# Patient Record
Sex: Female | Born: 1985 | Race: White | Hispanic: Yes | Marital: Single | Smoking: Former smoker
Health system: Southern US, Community
[De-identification: ages and names within clinical notes are randomized; demographics above are authoritative.]

## PROBLEM LIST (undated history)

## (undated) HISTORY — PX: DENTAL SURGERY: SHX609

---
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
## 2010-12-18 ENCOUNTER — Emergency Department: Payer: Self-pay | Admitting: Emergency Medicine

## 2012-02-12 IMAGING — CT CT ABD-PELV W/ CM
1 of 2 series · 15 of 32 positions shown, 19 images · IV contrast (isovue)
Comparison: none

REASON FOR EXAM: (1) RLQ pain; (2) same
COMMENTS:   LMP: Two weeks ago

PROCEDURE:     CT  - CT ABDOMEN / PELVIS  W  - December 18, 2010 [DATE]
RESULT:     Comparison:  None
TECHNIQUE: Multiple axial images of the abdomen and pelvis were performed
from the lung bases to the pubic symphysis, with p.o. contrast and with 100
mL of Isovue 370 intravenous contrast.

[Series 2: appendicitis · axial · 0.70mm/px · z∈[-347,+49]mm · 15 of 144 slices shown, 19 images]
[im 6/144  soft-tissue]
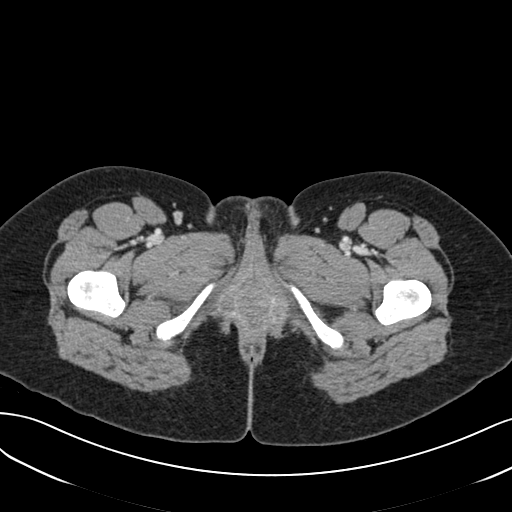
[im 6/144  bone]
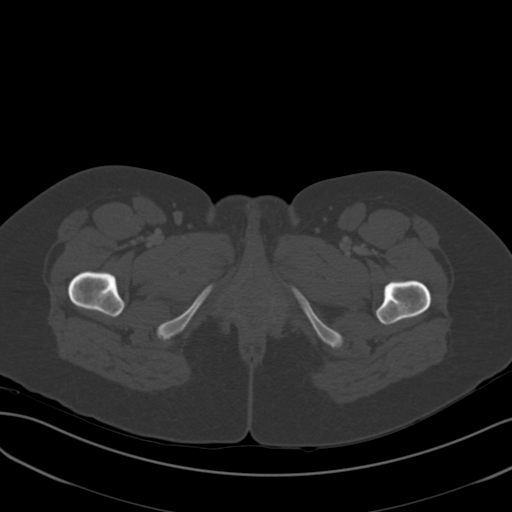
[im 17/144  soft-tissue]
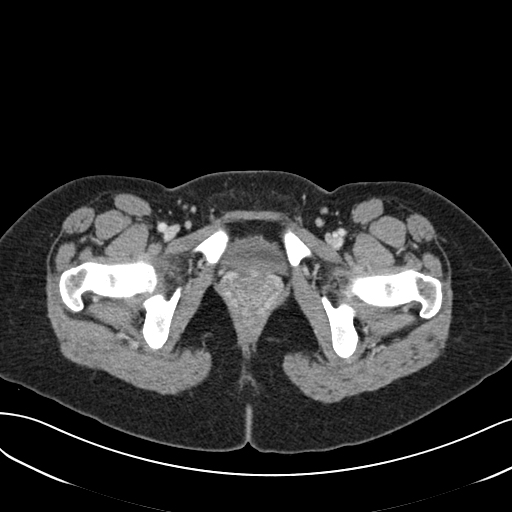
[im 28/144  soft-tissue]
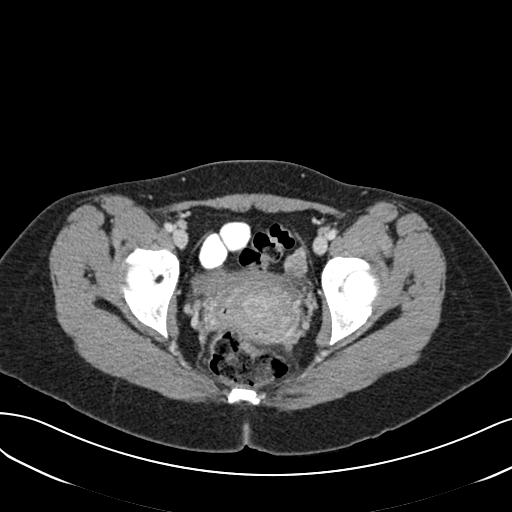
[im 39/144  soft-tissue]
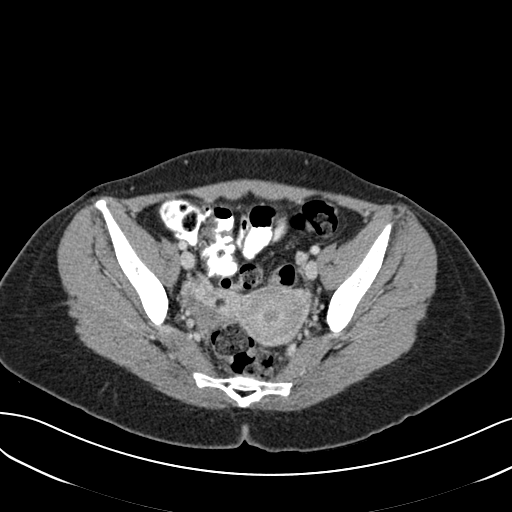
[im 50/144  soft-tissue]
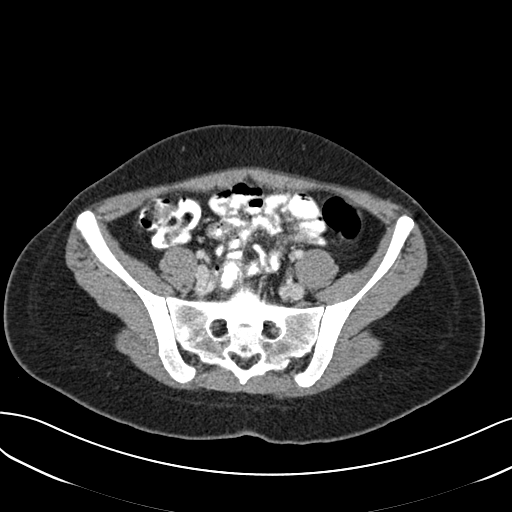
[im 61/144  soft-tissue]
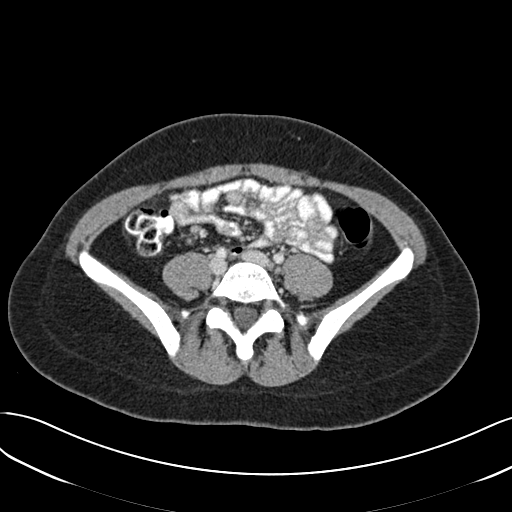
[im 72/144  soft-tissue]
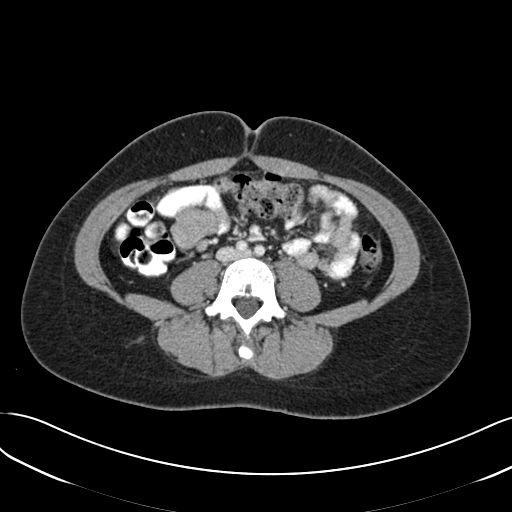
[im 83/144  soft-tissue]
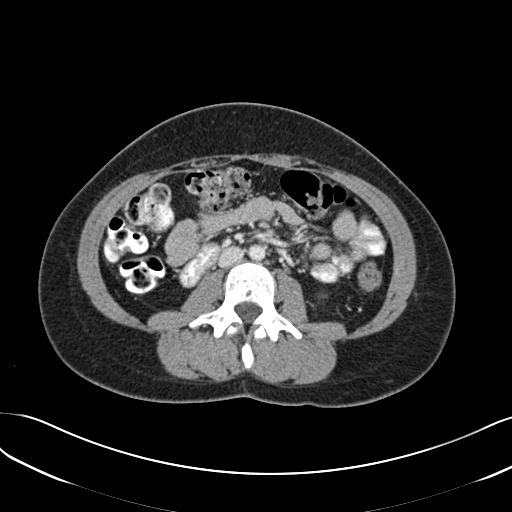
[im 94/144  soft-tissue]
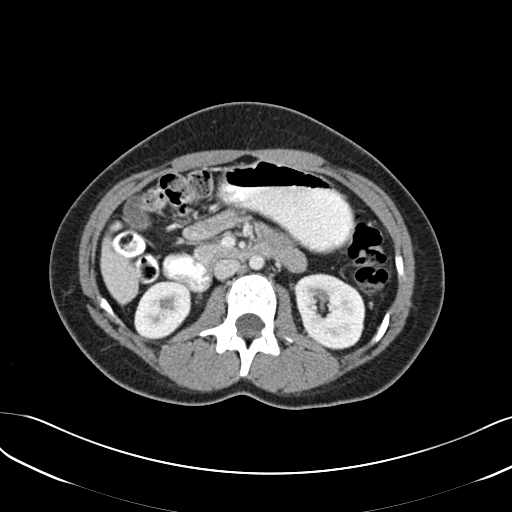
[im 94/144  bone]
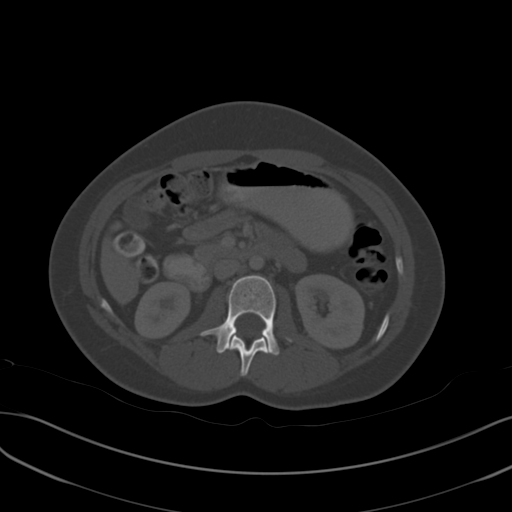
[im 105/144  soft-tissue]
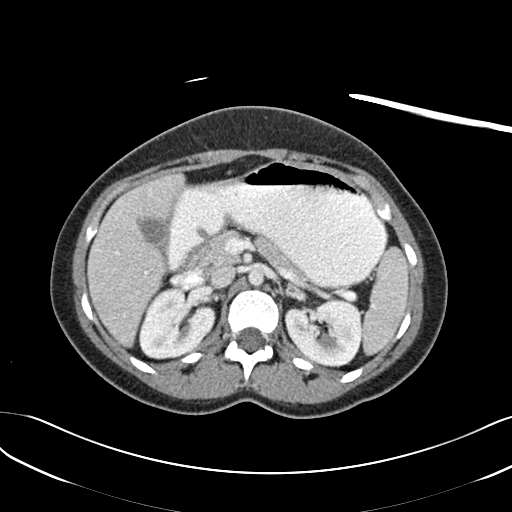
[im 116/144  soft-tissue]
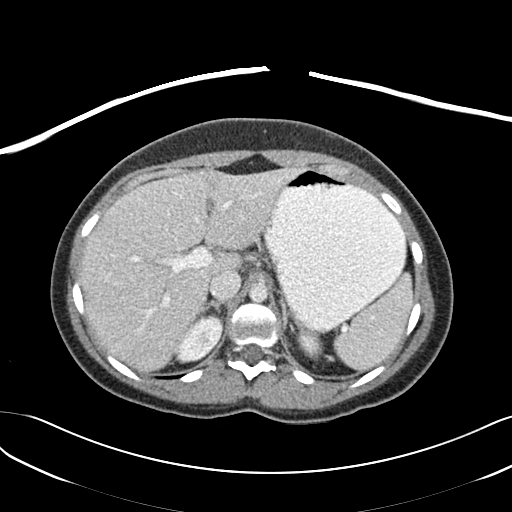
[im 122/144  lung]
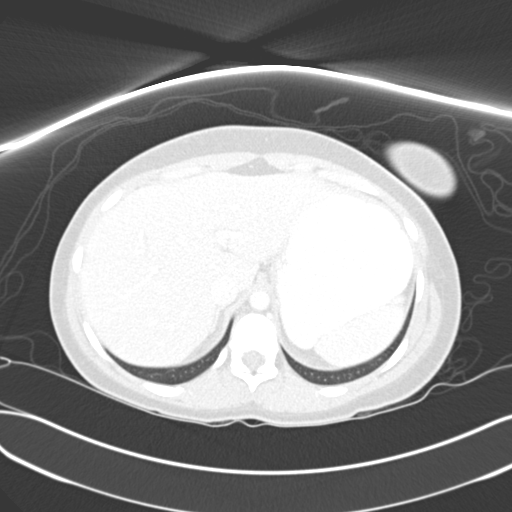
[im 127/144  soft-tissue]
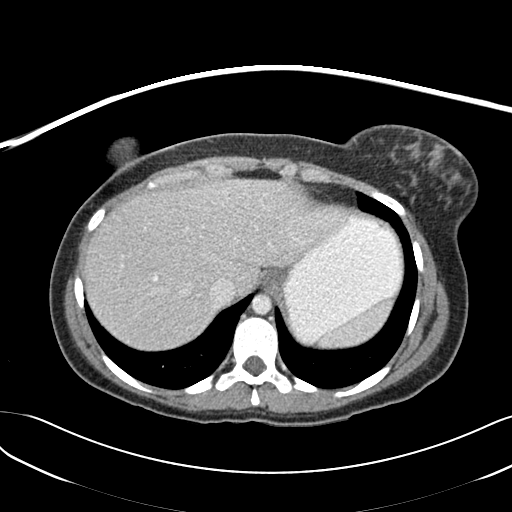
[im 127/144  lung]
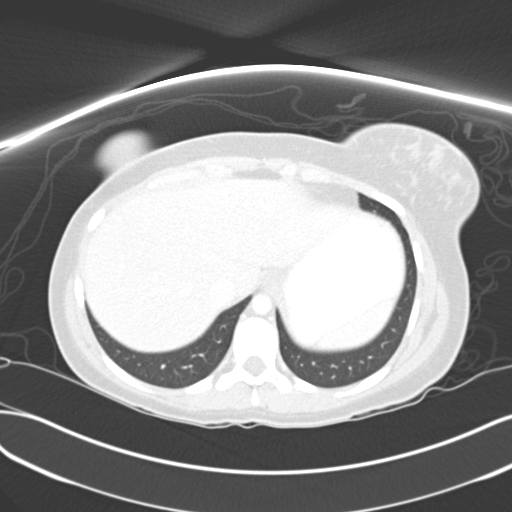
[im 133/144  lung]
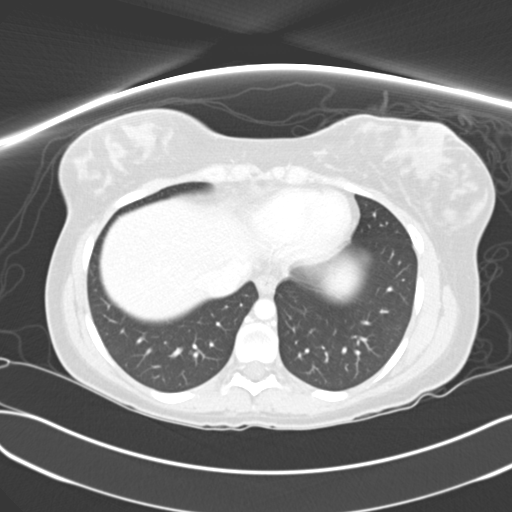
[im 138/144  soft-tissue]
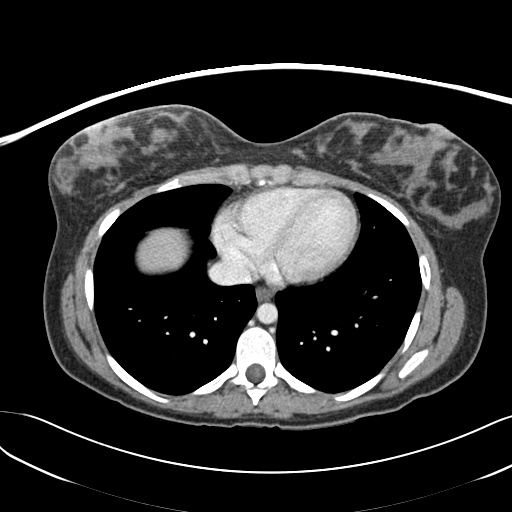
[im 138/144  lung]
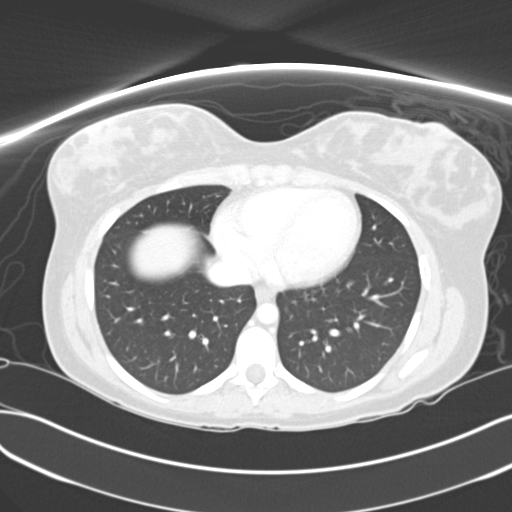

[15 of 32 positions shown; findings below may reference images not displayed]

FINDINGS: The liver enhances normally. Minimal low-attenuation along falciform
ligament likely represents focal fatty deposition. The spleen, adrenals,
pancreas, and gallbladder are unremarkable. The kidneys enhance normally.
The appendix is normal. The small and large bowel are normal in caliber.
There is a trace amount of air within the bladder, likely related to the
recent catheterization for the pelvic ultrasound. There are a few mildly
prominent, but not pathologically enlarged, lymph nodes in the right lower
quadrant.

No aggressive lytic or sclerotic osseous lesions identified.
IMPRESSION: No acute findings in the abdomen or pelvis. Scattered lymph nodes in the
right lower quadrant are nonspecific but can be seen etiologies such as
mesenteric adenitis.

## 2012-10-10 ENCOUNTER — Emergency Department: Payer: Self-pay | Admitting: Emergency Medicine

## 2014-06-03 ENCOUNTER — Emergency Department: Payer: Self-pay | Admitting: Emergency Medicine

## 2015-07-29 IMAGING — US US EXTREM LOW VENOUS*R*
1 series · 13 of 24 positions shown · non-contrast
Comparison: None.

CLINICAL DATA: Right leg pain



[Series 1: us extrem low venous*right* · 0.09mm/px · 13 of 42 slices shown]
[im 1/42]
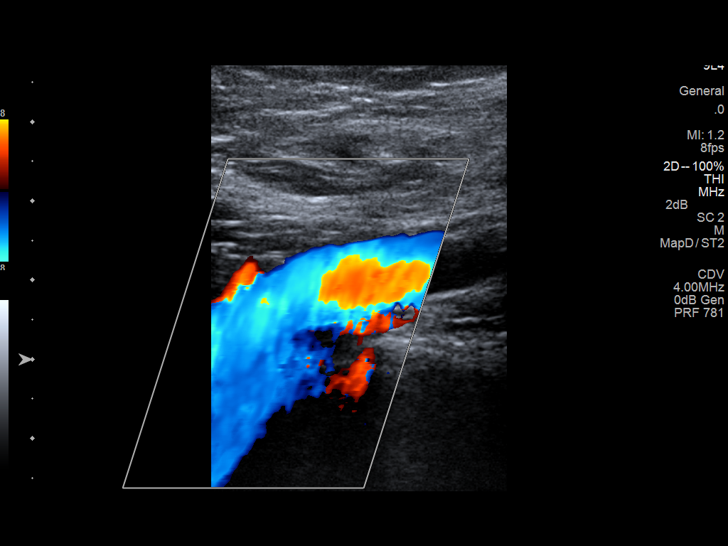
[im 4/42]
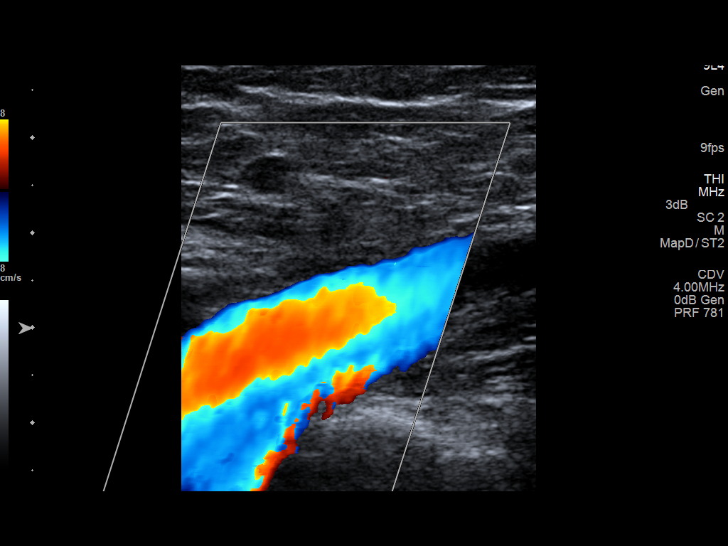
[im 8/42]
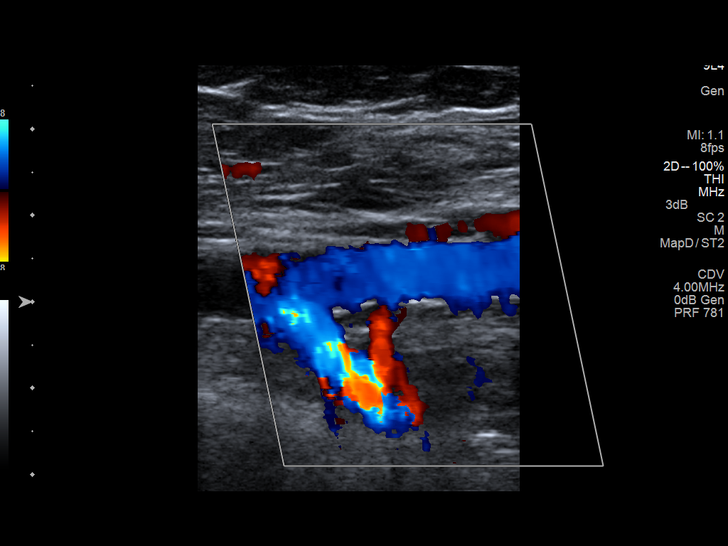
[im 11/42]
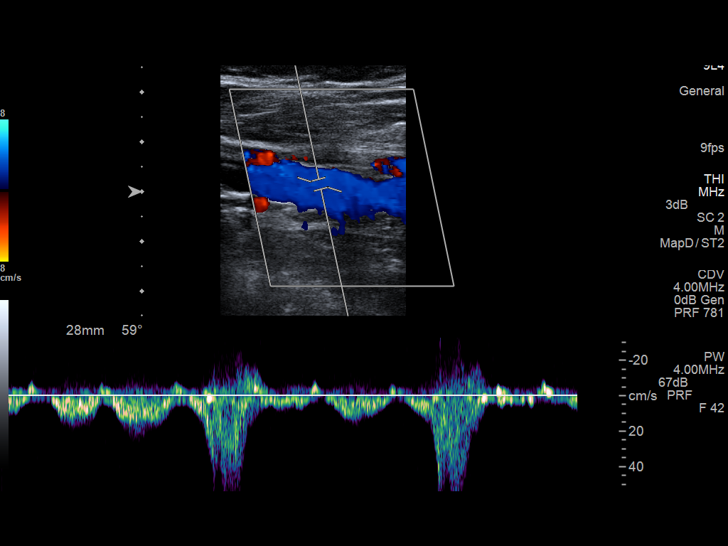
[im 15/42]
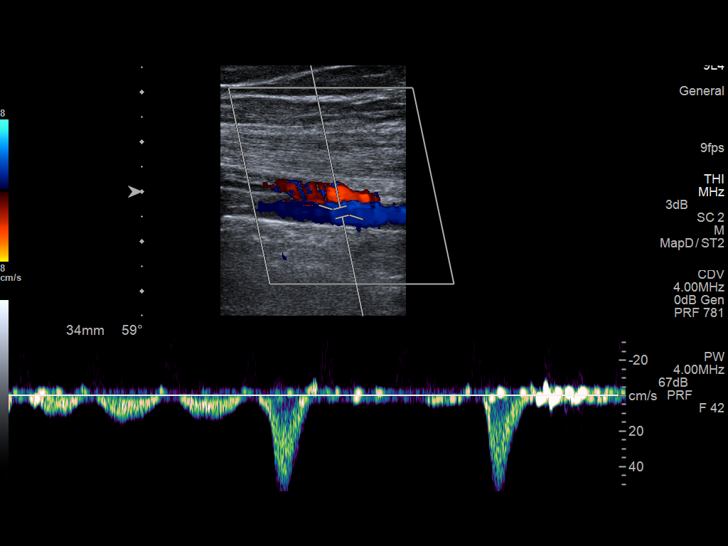
[im 18/42]
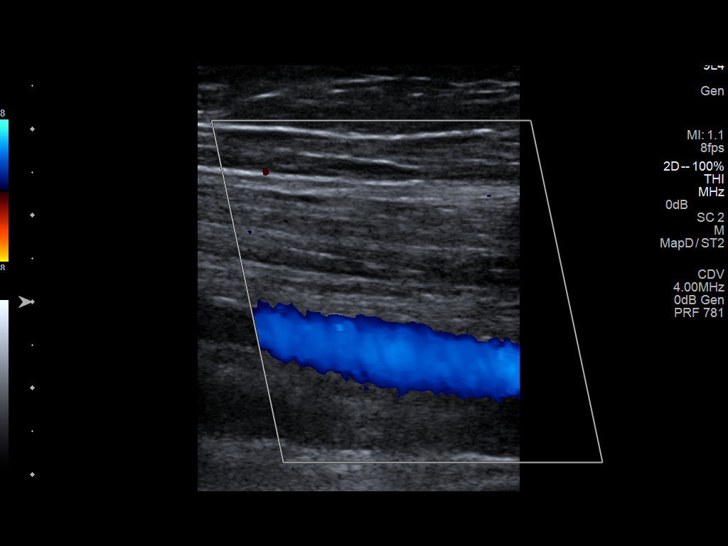
[im 22/42]
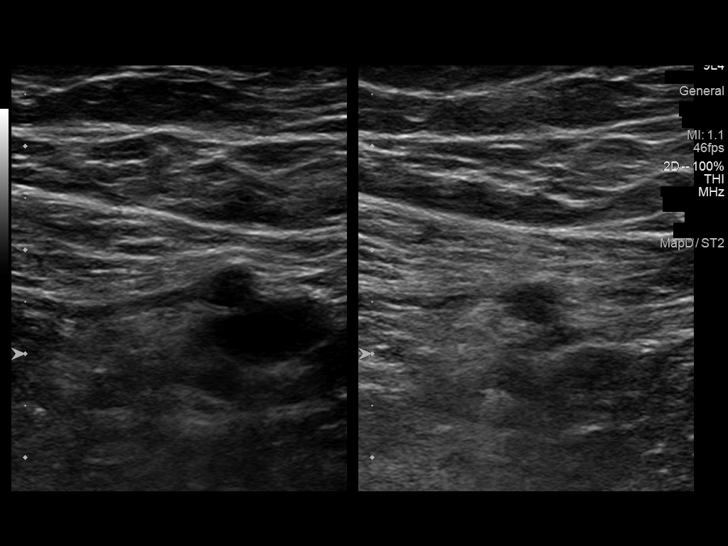
[im 24/42]
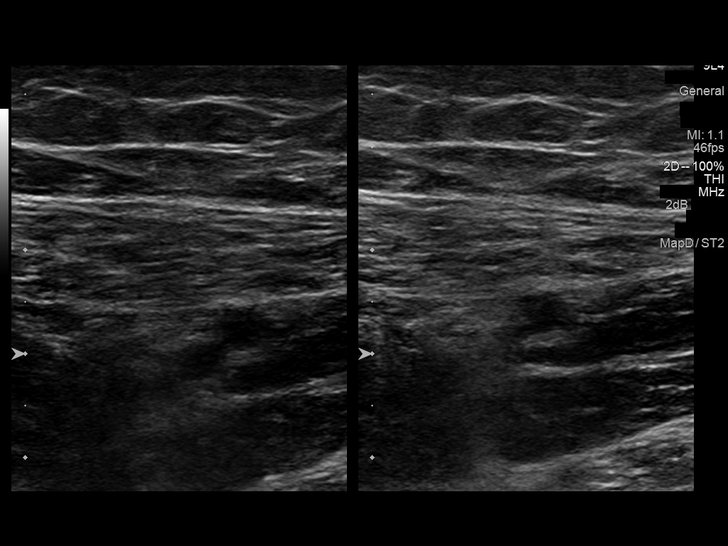
[im 27/42]
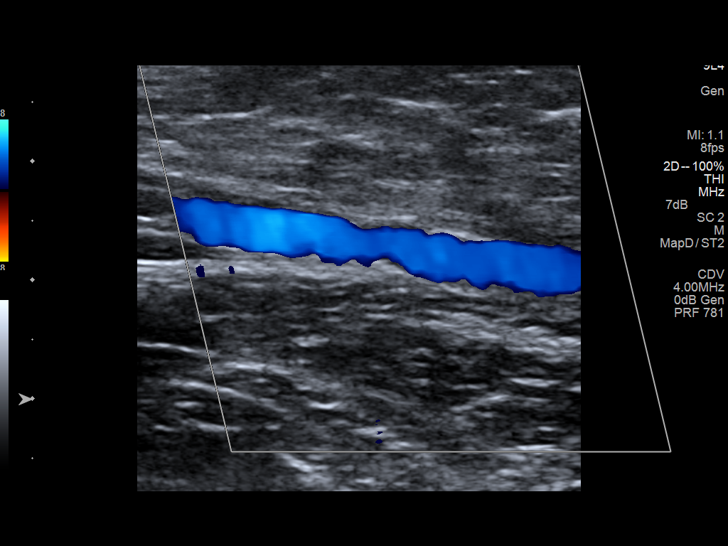
[im 31/42]
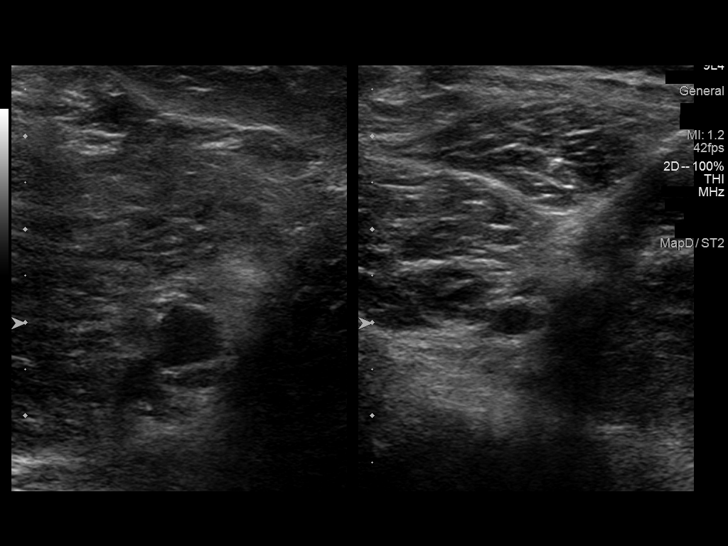
[im 34/42]
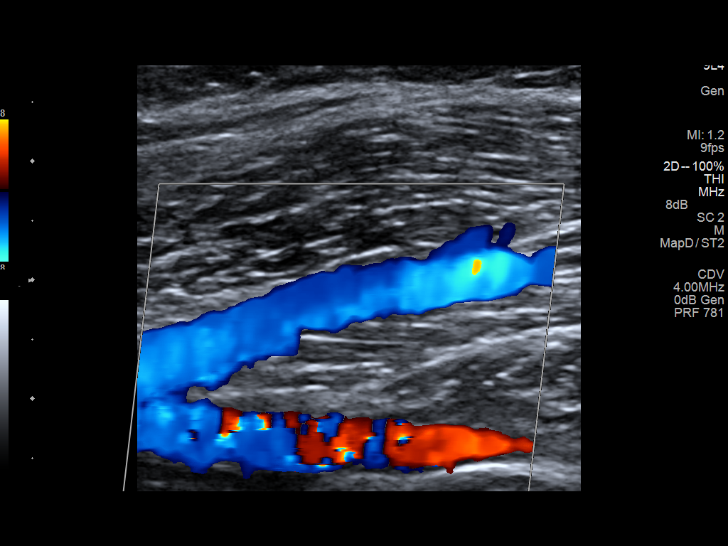
[im 38/42]
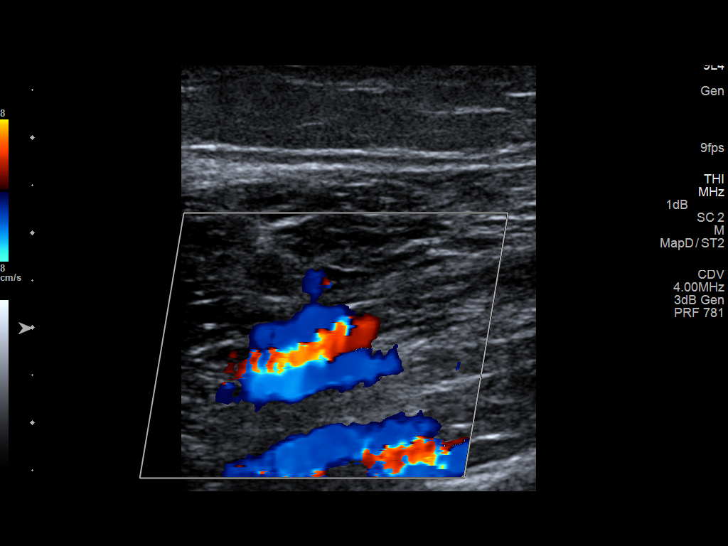
[im 42/42]
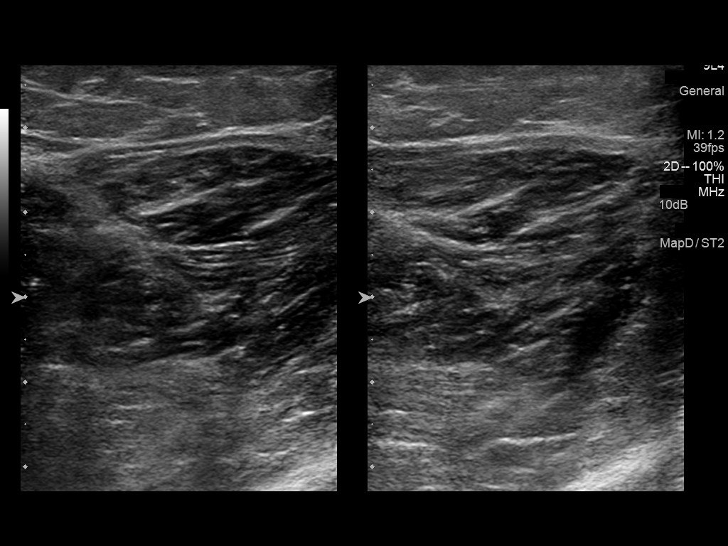

[13 of 24 positions shown; findings below may reference images not displayed]

FINDINGS: Contralateral Common Femoral Vein: Respiratory phasicity is normal
and symmetric with the symptomatic side. No evidence of thrombus.
Normal compressibility.

Common Femoral Vein: No evidence of thrombus. Normal
compressibility, respiratory phasicity and response to augmentation.

Saphenofemoral Junction: No evidence of thrombus. Normal
compressibility and flow on color Doppler imaging.

Profunda Femoral Vein: No evidence of thrombus. Normal
compressibility and flow on color Doppler imaging.

Femoral Vein: No evidence of thrombus. Normal compressibility,
respiratory phasicity and response to augmentation.

Popliteal Vein: No evidence of thrombus. Normal compressibility,
respiratory phasicity and response to augmentation.

Calf Veins: No evidence of thrombus. Normal compressibility and flow
on color Doppler imaging.

Superficial Great Saphenous Vein: No evidence of thrombus. Normal
compressibility and flow on color Doppler imaging.

Venous Reflux:  None.

Other Findings:  None.
IMPRESSION: No evidence of deep venous thrombosis in the right lower extremity.

## 2015-12-16 ENCOUNTER — Emergency Department
Admission: EM | Admit: 2015-12-16 | Discharge: 2015-12-16 | Disposition: A | Discharge status: Home / Self Care | Payer: Self-pay | Attending: Emergency Medicine | Admitting: Emergency Medicine

## 2015-12-16 ENCOUNTER — Emergency Department: Payer: Self-pay

## 2015-12-16 DIAGNOSIS — Y939 Activity, unspecified: Secondary | ICD-10-CM | POA: Insufficient documentation

## 2015-12-16 DIAGNOSIS — Y999 Unspecified external cause status: Secondary | ICD-10-CM | POA: Insufficient documentation

## 2015-12-16 DIAGNOSIS — F17200 Nicotine dependence, unspecified, uncomplicated: Secondary | ICD-10-CM | POA: Insufficient documentation

## 2015-12-16 DIAGNOSIS — Y929 Unspecified place or not applicable: Secondary | ICD-10-CM | POA: Insufficient documentation

## 2015-12-16 DIAGNOSIS — W2201XA Walked into wall, initial encounter: Secondary | ICD-10-CM | POA: Insufficient documentation

## 2015-12-16 DIAGNOSIS — S60221A Contusion of right hand, initial encounter: Secondary | ICD-10-CM | POA: Insufficient documentation

## 2015-12-16 MED ORDER — NAPROXEN 500 MG PO TABS: 500.0000 mg | ORAL_TABLET | Freq: Two times a day (BID) | ORAL | Status: DC

## 2015-12-16 MED ORDER — TRAMADOL HCL 50 MG PO TABS
50.0000 mg | ORAL_TABLET | Freq: Four times a day (QID) | ORAL | Status: DC | PRN
Start: 2015-12-16 — End: 2021-07-26

## 2015-12-16 NOTE — ED Notes (Signed)
Pt states she punched a wall yesterday and has had shooting pains down the lateral side of pinky to elbow ever since.  States she went to work, but they told her to come get a note that says it is okay to come back.  States arm feels like it is "asleep".

## 2015-12-16 NOTE — Discharge Instructions (Signed)
Hand Contusion  A hand contusion is a deep bruise on your hand area. Contusions are the result of an injury that caused bleeding under the skin. The contusion may turn blue, purple, or yellow. Minor injuries will give you a painless contusion, but more severe contusions may stay painful and swollen for a few weeks.  CAUSES   A contusion is usually caused by a blow, trauma, or direct force to an area of the body.  SYMPTOMS    Swelling and redness of the injured area.   Discoloration of the injured area.   Tenderness and soreness of the injured area.   Pain.  DIAGNOSIS   The diagnosis can be made by taking a history and performing a physical exam. An X-ray, CT scan, or MRI may be needed to determine if there were any associated injuries, such as broken bones (fractures).  TREATMENT   Often, the best treatment for a hand contusion is resting, elevating, icing, and applying cold compresses to the injured area. Over-the-counter medicines may also be recommended for pain control.  HOME CARE INSTRUCTIONS    Put ice on the injured area.    Put ice in a plastic bag.    Place a towel between your skin and the bag.    Leave the ice on for 15-20 minutes, 03-04 times a day.   Only take over-the-counter or prescription medicines as directed by your caregiver. Your caregiver may recommend avoiding anti-inflammatory medicines (aspirin, ibuprofen, and naproxen) for 48 hours because these medicines may increase bruising.   If told, use an elastic wrap as directed. This can help reduce swelling. You may remove the wrap for sleeping, showering, and bathing. If your fingers become numb, cold, or blue, take the wrap off and reapply it more loosely.   Elevate your hand with pillows to reduce swelling.   Avoid overusing your hand if it is painful.  SEEK IMMEDIATE MEDICAL CARE IF:    You have increased redness, swelling, or pain in your hand.   Your swelling or pain is not relieved with medicines.   You have loss of feeling in  your hand or are unable to move your fingers.   Your hand turns cold or blue.   You have pain when you move your fingers.   Your hand becomes warm to the touch.   Your contusion does not improve in 2 days.  MAKE SURE YOU:    Understand these instructions.   Will watch your condition.   Will get help right away if you are not doing well or get worse.     This information is not intended to replace advice given to you by your health care provider. Make sure you discuss any questions you have with your health care provider.     Document Released: 12/22/2001 Document Revised: 03/26/2012 Document Reviewed: 12/24/2011  Elsevier Interactive Patient Education 2016 [employer]

## 2015-12-16 NOTE — ED Provider Notes (Signed)
[facility] Emergency Department Provider Note ____________________________________________  Time seen: Approximately 7:58 PM  I have reviewed the triage vital signs and the nursing notes.   HISTORY  Chief Complaint Arm Pain    HPI Shelby Acosta is a 30 y.o. female presents to the emergency department for evaluation of right hand pain. She states that she punched a wall yesterday and has had shooting pains from the pinky and ring finger down to the elbow since that time. She has not taken anything for pain. She denies previous fracture.She is right-hand dominant.  History reviewed. No pertinent past medical history.  There are no active problems to display for this patient.   History reviewed. No pertinent past surgical history.  Current Outpatient Rx  Name  Route  Sig  Dispense  Refill  . naproxen (NAPROSYN) 500 MG tablet   Oral   Take 1 tablet (500 mg total) by mouth 2 (two) times daily with a meal.   30 tablet   0   . traMADol (ULTRAM) 50 MG tablet   Oral   Take 1 tablet (50 mg total) by mouth every 6 (six) hours as needed.   12 tablet   0     Allergies Review of patient's allergies indicates no known allergies.  No family history on file.  Social History Social History  Substance Use Topics  . Smoking status: Current Every Day Smoker  . Smokeless tobacco: None  . Alcohol Use: None    Review of Systems Constitutional: No recent illness. Cardiovascular: Denies chest pain or palpitations. Respiratory: Denies shortness of breath. Musculoskeletal: Pain in Right hand. Skin: Negative for rash, wound, lesion. Neurological: Negative for focal weakness or numbness.  ____________________________________________   PHYSICAL EXAM:  VITAL SIGNS: ED Triage Vitals  Enc Vitals Group     BP --      Pulse --      Resp --      Temp --      Temp src --      SpO2 --      Weight --      Height --      Head Cir --      Peak Flow --       Pain Score 12/16/15 1945 4     Pain Loc --      Pain Edu? --      Excl. in GC? --     Constitutional: Alert and oriented. Well appearing and in no acute distress. Eyes: Conjunctivae are normal. EOMI. Head: Atraumatic. Neck: No stridor.  Respiratory: Normal respiratory effort.   Musculoskeletal: Tenderness over the MCP and metacarpals of the fourth and fifth joints of the right hand. Neurologic:  Normal speech and language. No gross focal neurologic deficits are appreciated. Speech is normal. No gait instability. Skin:  Skin is warm, dry and intact. Contusion noted over the MCP of the fourth and fifth digits of the right hand. Psychiatric: Mood and affect are normal. Speech and behavior are normal.  ____________________________________________   LABS (all labs ordered are listed, but only abnormal results are displayed)  Labs Reviewed - No data to display ____________________________________________  RADIOLOGY  No evidence of acute bony abnormality of the right hand per radiology.  I, Kem Boroughsari Kodee Ravert, personally viewed and evaluated these images (plain radiographs) as part of my medical decision making, as well as reviewing the written report by the radiologist.  ____________________________________________   PROCEDURES  Procedure(s) performed:   Cock-up splint applied by  ER tech. Patient was neurovascularly intact post-application.   ____________________________________________   INITIAL IMPRESSION / ASSESSMENT AND PLAN / ED COURSE  Pertinent labs & imaging results that were available during my care of the patient were reviewed by me and considered in my medical decision making (see chart for details).  Patient was encouraged to follow up with orthopedics for symptoms that are not improving over the week. She was encouraged to take the tramadol and Naprosyn as prescribed if needed. She was advised to return to the emergency department for symptoms change or worsen  if some unable schedule an appointment. ____________________________________________   FINAL CLINICAL IMPRESSION(S) / ED DIAGNOSES  Final diagnoses:  Contusion, hand, right, initial encounter       Chinita Pester, FNP 12/16/15 2121  Loleta Rose, MD 12/16/15 2223

## 2017-02-09 IMAGING — CR DG HAND COMPLETE 3+V*R*
1 series · 3 of 3 positions shown · non-contrast
Comparison: None.

CLINICAL DATA: Pain after trauma

EXAM:
RIGHT HAND - COMPLETE 3+ VIEW

[Series 1: x hand pa right · 0.14mm/px · 3 of 3 slices shown]
[im 1/3]
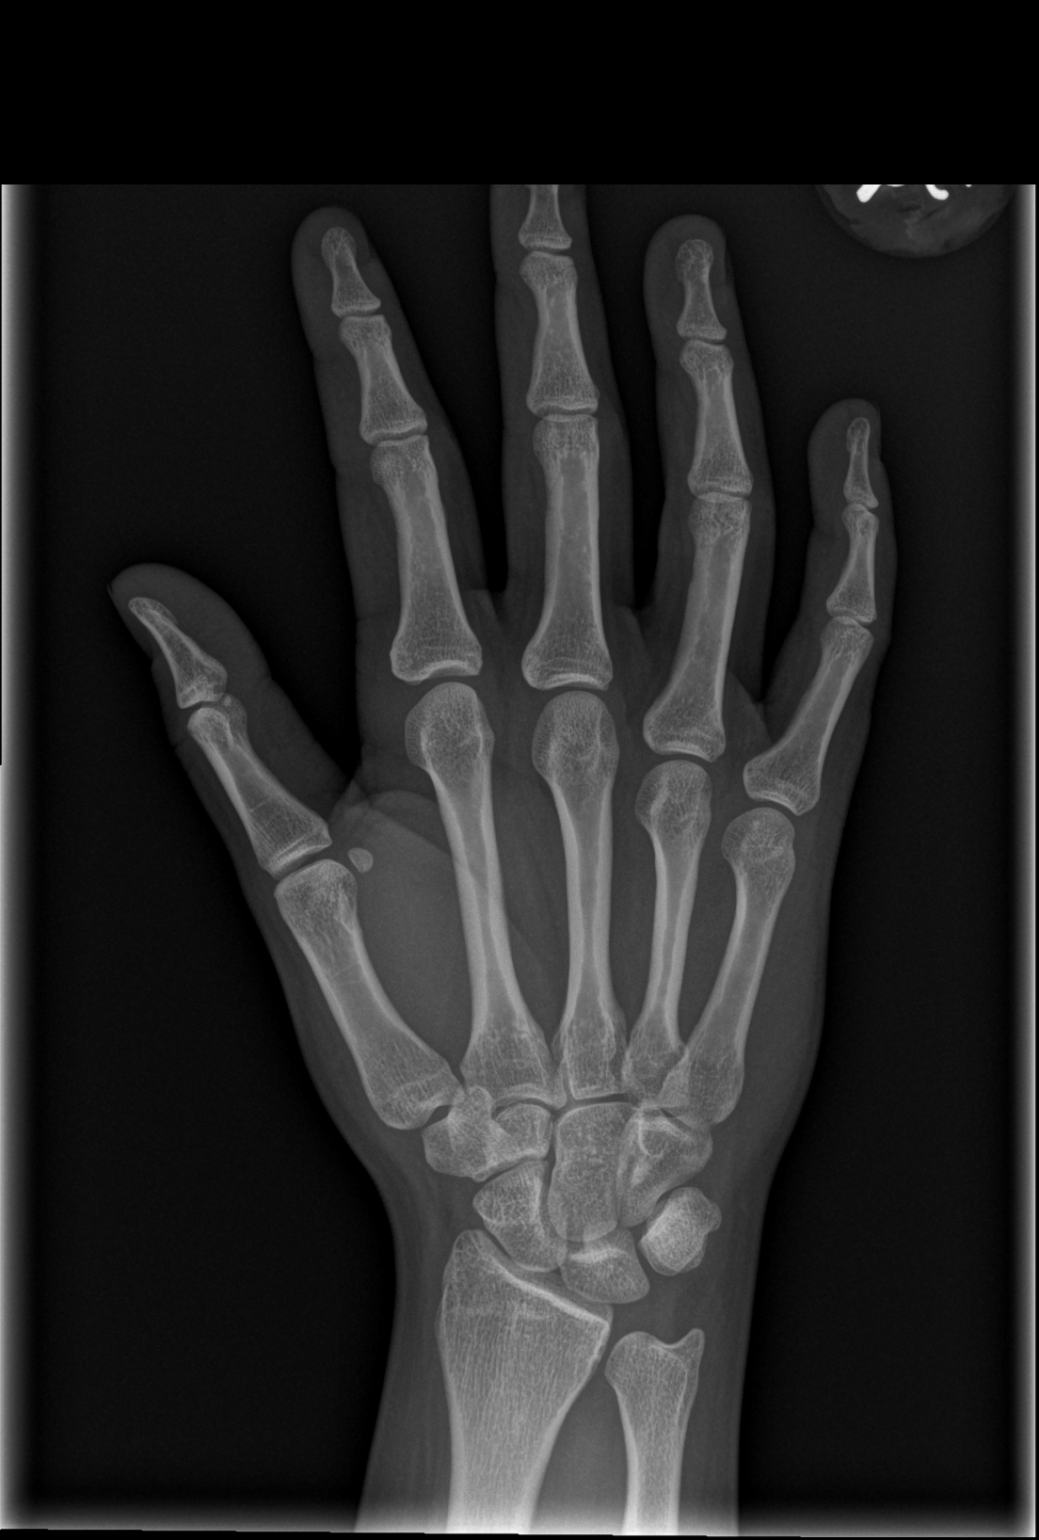
[im 2/3]
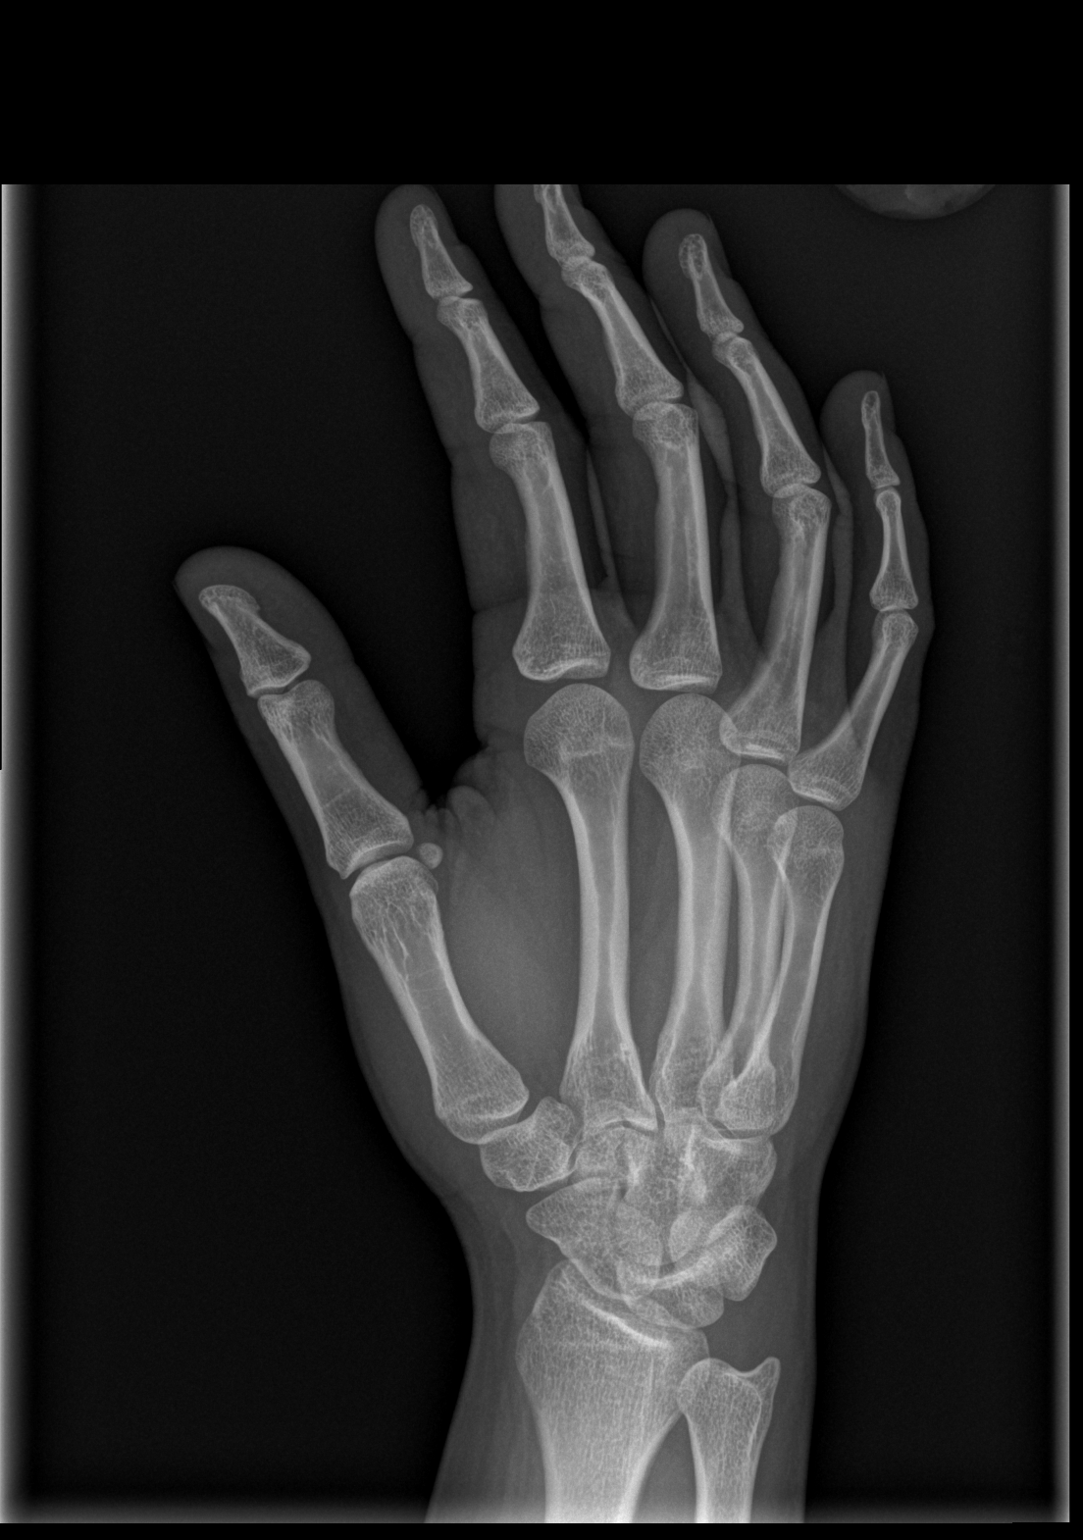
[im 3/3]
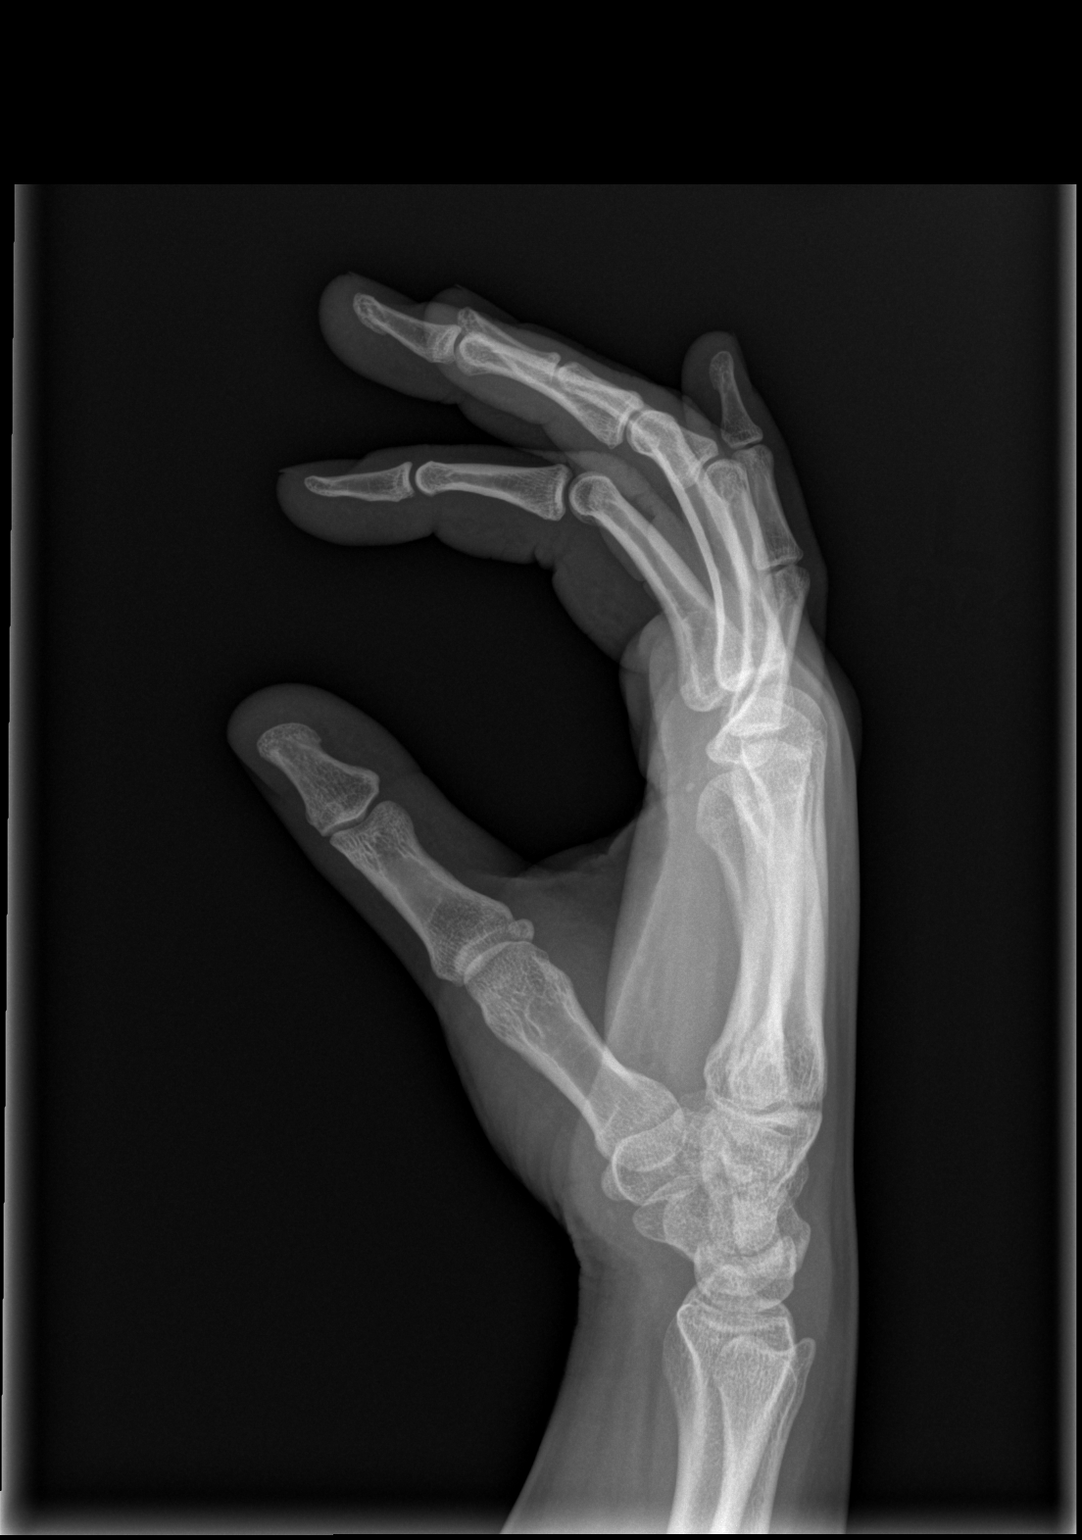

[3 of 3 positions shown; findings below may reference images not displayed]

FINDINGS: There is no evidence of fracture or dislocation. There is no
evidence of arthropathy or other focal bone abnormality. Soft
tissues are unremarkable.
IMPRESSION: Negative.

## 2020-12-19 ENCOUNTER — Ambulatory Visit: Payer: Self-pay | Admitting: Nurse Practitioner

## 2020-12-19 ENCOUNTER — Ambulatory Visit: Payer: Self-pay | Admitting: Internal Medicine

## 2021-05-10 ENCOUNTER — Ambulatory Visit: Payer: Self-pay | Admitting: Family Medicine

## 2021-07-26 ENCOUNTER — Other Ambulatory Visit: Payer: Self-pay

## 2021-07-26 ENCOUNTER — Ambulatory Visit (INDEPENDENT_AMBULATORY_CARE_PROVIDER_SITE_OTHER): Admitting: Family Medicine

## 2021-07-26 ENCOUNTER — Encounter: Payer: Self-pay | Admitting: Family Medicine

## 2021-07-26 VITALS — BP 114/76 | HR 52 | Temp 98.0°F | Ht 59.5 in | Wt 122.6 lb

## 2021-07-26 DIAGNOSIS — Z0000 Encounter for general adult medical examination without abnormal findings: Secondary | ICD-10-CM | POA: Diagnosis not present

## 2021-07-26 DIAGNOSIS — Z23 Encounter for immunization: Secondary | ICD-10-CM

## 2021-07-26 LAB — URINALYSIS, ROUTINE W REFLEX MICROSCOPIC
Bilirubin, UA: NEGATIVE
Glucose, UA: NEGATIVE
Ketones, UA: NEGATIVE
Leukocytes,UA: NEGATIVE
Nitrite, UA: NEGATIVE
RBC, UA: NEGATIVE
Specific Gravity, UA: 1.025 (ref 1.005–1.030)
Urobilinogen, Ur: 1 mg/dL (ref 0.2–1.0)
pH, UA: 6.5 (ref 5.0–7.5)

## 2021-07-26 NOTE — Progress Notes (Signed)
BP 114/76    Pulse (!) 52    Temp 98 F (36.7 C)    Ht 4' 11.5" (1.511 m)    Wt 122 lb 9.6 oz (55.6 kg)    LMP 07/26/2021    SpO2 99%    BMI 24.35 kg/m    Subjective:    Patient ID: Shelby Acosta, female    DOB: 1986/03/28, 36 y.o.   MRN: [id number]  HPI: Shelby Acosta is a 36 y.o. female presenting on 07/26/2021 to establish care and for comprehensive medical examination. Current medical complaints include:none. Has not been to the doctor in many years. She probably has not seen anyone since she was 36yo  She currently lives with: wife Menopausal Symptoms: no  Depression Screen done today and results listed below:  Depression screen [facility] 2/9 07/26/2021  Decreased Interest 0  Down, Depressed, Hopeless 0  PHQ - 2 Score 0  Altered sleeping 0  Tired, decreased energy 0  Change in appetite 1  Feeling bad or failure about yourself  0  Trouble concentrating 0  Moving slowly or fidgety/restless 0  Suicidal thoughts 0  PHQ-9 Score 1  Difficult doing work/chores Not difficult at all    Past Medical History:  History reviewed. No pertinent past medical history.  Surgical History:  Past Surgical History:  Procedure Laterality Date   DENTAL SURGERY      Medications:  No current outpatient medications on file prior to visit.   No current facility-administered medications on file prior to visit.    Allergies:  No Known Allergies  Social History:  Social History   Socioeconomic History   Marital status: Single    Spouse name: Not on file   Number of children: Not on file   Years of education: Not on file   Highest education level: Not on file  Occupational History   Not on file  Tobacco Use   Smoking status: Former    Types: Cigarettes    Quit date: 11/2018    Years since quitting: 2.6   Smokeless tobacco: Not on file  Vaping Use   Vaping Use: Never used  Substance and Sexual Activity   Alcohol use: Yes    Comment: rarely   Drug use: Yes    Types:  Marijuana   Sexual activity: Yes    Birth control/protection: None  Other Topics Concern   Not on file  Social History Narrative   Not on file   Social Determinants of Health   Financial Resource Strain: Not on file  Food Insecurity: Not on file  Transportation Needs: Not on file  Physical Activity: Not on file  Stress: Not on file  Social Connections: Not on file  Intimate Partner Violence: Not on file   Social History   Tobacco Use  Smoking Status Former   Types: Cigarettes   Quit date: 11/2018   Years since quitting: 2.6  Smokeless Tobacco Not on file   Social History   Substance and Sexual Activity  Alcohol Use Yes   Comment: rarely    Family History:  Family History  Problem Relation Age of Onset   Diabetes Mother    Cancer Mother    Hypertension Father    Cancer Paternal Aunt        breast    Past medical history, surgical history, medications, allergies, family history and social history reviewed with patient today and changes made to appropriate areas of the chart.   Review of Systems  Constitutional: Negative.  HENT: Negative.    Eyes: Negative.   Respiratory: Negative.    Cardiovascular: Negative.   Gastrointestinal: Negative.   Genitourinary: Negative.   Musculoskeletal: Negative.   Skin: Negative.   Neurological:  Positive for dizziness (with motion sickness). Negative for tingling, tremors, sensory change, speech change, focal weakness, seizures, loss of consciousness, weakness and headaches.  Endo/Heme/Allergies:  Negative for environmental allergies and polydipsia. Bruises/bleeds easily.  Psychiatric/Behavioral: Negative.    All other ROS negative except what is listed above and in the HPI.      Objective:    BP 114/76    Pulse (!) 52    Temp 98 F (36.7 C)    Ht 4' 11.5" (1.511 m)    Wt 122 lb 9.6 oz (55.6 kg)    LMP 07/26/2021    SpO2 99%    BMI 24.35 kg/m   Wt Readings from Last 3 Encounters:  07/26/21 122 lb 9.6 oz (55.6 kg)   12/16/15 125 lb (56.7 kg)    Physical Exam Vitals and nursing note reviewed.  Constitutional:      General: She is not in acute distress.    Appearance: Normal appearance. She is not ill-appearing, toxic-appearing or diaphoretic.  HENT:     Head: Normocephalic and atraumatic.     Right Ear: Tympanic membrane, ear canal and external ear normal. There is no impacted cerumen.     Left Ear: Tympanic membrane, ear canal and external ear normal. There is no impacted cerumen.     Nose: Nose normal. No congestion or rhinorrhea.     Mouth/Throat:     Mouth: Mucous membranes are moist.     Pharynx: Oropharynx is clear. No oropharyngeal exudate or posterior oropharyngeal erythema.  Eyes:     General: No scleral icterus.       Right eye: No discharge.        Left eye: No discharge.     Extraocular Movements: Extraocular movements intact.     Conjunctiva/sclera: Conjunctivae normal.     Pupils: Pupils are equal, round, and reactive to light.  Neck:     Vascular: No carotid bruit.  Cardiovascular:     Rate and Rhythm: Normal rate and regular rhythm.     Pulses: Normal pulses.     Heart sounds: No murmur heard.   No friction rub. No gallop.  Pulmonary:     Effort: Pulmonary effort is normal. No respiratory distress.     Breath sounds: Normal breath sounds. No stridor. No wheezing, rhonchi or rales.  Chest:     Chest wall: No tenderness.  Abdominal:     General: Abdomen is flat. Bowel sounds are normal. There is no distension.     Palpations: Abdomen is soft. There is no mass.     Tenderness: There is no abdominal tenderness. There is no right CVA tenderness, left CVA tenderness, guarding or rebound.     Hernia: No hernia is present.  Genitourinary:    Comments: Breast and pelvic exams deferred with shared decision making Musculoskeletal:        General: No swelling, tenderness, deformity or signs of injury.     Cervical back: Normal range of motion and neck supple. No rigidity. No  muscular tenderness.     Right lower leg: No edema.     Left lower leg: No edema.  Lymphadenopathy:     Cervical: No cervical adenopathy.  Skin:    General: Skin is warm and dry.     Capillary Refill:  Capillary refill takes less than 2 seconds.     Coloration: Skin is not jaundiced or pale.     Findings: No bruising, erythema, lesion or rash.  Neurological:     General: No focal deficit present.     Mental Status: She is alert and oriented to person, place, and time. Mental status is at baseline.     Cranial Nerves: No cranial nerve deficit.     Sensory: No sensory deficit.     Motor: No weakness.     Coordination: Coordination normal.     Gait: Gait normal.     Deep Tendon Reflexes: Reflexes normal.  Psychiatric:        Mood and Affect: Mood normal.        Behavior: Behavior normal.        Thought Content: Thought content normal.        Judgment: Judgment normal.    No results found for this or any previous visit.    Assessment & Plan:   Problem List Items Addressed This Visit   None Visit Diagnoses     Routine general medical examination at a health care facility    -  Primary   Relevant Orders   CBC with Differential/Platelet   Comprehensive metabolic panel   Lipid Panel w/o Chol/HDL Ratio   Urinalysis, Routine w reflex microscopic   TSH   Hepatitis C Antibody   HIV Antibody (routine testing w rflx)        Follow up plan: Return in about 6 months (around 01/23/2022) for pap.   LABORATORY TESTING:  - Pap smear:  Will get her back to do  IMMUNIZATIONS:   - Tdap: Tetanus vaccination status reviewed: Tdap vaccination indicated and given today. - Influenza: Refused - Pneumovax: Refused - Prevnar: Not applicable - COVID: Up to date  PATIENT COUNSELING:   Advised to take 1 mg of folate supplement per day if capable of pregnancy.   Sexuality: Discussed sexually transmitted diseases, partner selection, use of condoms, avoidance of unintended pregnancy  and  contraceptive alternatives.   Advised to avoid cigarette smoking.  I discussed with the patient that most people either abstain from alcohol or drink within safe limits (<=14/week and <=4 drinks/occasion for males, <=7/weeks and <= 3 drinks/occasion for females) and that the risk for alcohol disorders and other health effects rises proportionally with the number of drinks per week and how often a drinker exceeds daily limits.  Discussed cessation/primary prevention of drug use and availability of treatment for abuse.   Diet: Encouraged to adjust caloric intake to maintain  or achieve ideal body weight, to reduce intake of dietary saturated fat and total fat, to limit sodium intake by avoiding high sodium foods and not adding table salt, and to maintain adequate dietary potassium and calcium preferably from fresh fruits, vegetables, and low-fat dairy products.    stressed the importance of regular exercise  Injury prevention: Discussed safety belts, safety helmets, smoke detector, smoking near bedding or upholstery.   Dental health: Discussed importance of regular tooth brushing, flossing, and dental visits.    NEXT PREVENTATIVE PHYSICAL DUE IN 1 YEAR. Return in about 6 months (around 01/23/2022) for pap.

## 2021-07-27 ENCOUNTER — Encounter: Payer: Self-pay | Admitting: Family Medicine

## 2021-07-27 LAB — LIPID PANEL W/O CHOL/HDL RATIO
Cholesterol, Total: 213 mg/dL — ABNORMAL HIGH (ref 100–199)
HDL: 82 mg/dL (ref >39)
LDL Chol Calc (NIH): 119 mg/dL — ABNORMAL HIGH (ref 0–99)
Triglycerides: 67 mg/dL (ref 0–149)
VLDL Cholesterol Cal: 12 mg/dL (ref 5–40)

## 2021-07-27 LAB — CBC WITH DIFFERENTIAL/PLATELET
Basophils Absolute: 0 10*3/uL (ref 0.0–0.2)
Basos: 1 %
EOS (ABSOLUTE): 0 10*3/uL (ref 0.0–0.4)
Eos: 1 %
Hematocrit: 43.2 % (ref 34.0–46.6)
Hemoglobin: 13.9 g/dL (ref 11.1–15.9)
Immature Grans (Abs): 0 10*3/uL (ref 0.0–0.1)
Immature Granulocytes: 0 %
Lymphocytes Absolute: 1.3 10*3/uL (ref 0.7–3.1)
Lymphs: 39 %
MCH: 28.4 pg (ref 26.6–33.0)
MCHC: 32.2 g/dL (ref 31.5–35.7)
MCV: 88 fL (ref 79–97)
Monocytes Absolute: 0.4 10*3/uL (ref 0.1–0.9)
Monocytes: 13 %
Neutrophils Absolute: 1.5 10*3/uL (ref 1.4–7.0)
Neutrophils: 46 %
Platelets: 294 10*3/uL (ref 150–450)
RBC: 4.9 x10E6/uL (ref 3.77–5.28)
RDW: 12.8 % (ref 11.7–15.4)
WBC: 3.3 10*3/uL — ABNORMAL LOW (ref 3.4–10.8)

## 2021-07-27 LAB — COMPREHENSIVE METABOLIC PANEL
ALT: 15 IU/L (ref 0–32)
AST: 23 IU/L (ref 0–40)
Albumin/Globulin Ratio: 2.1 (ref 1.2–2.2)
Albumin: 4.7 g/dL (ref 3.8–4.8)
Alkaline Phosphatase: 48 IU/L (ref 44–121)
BUN/Creatinine Ratio: 11 (ref 9–23)
BUN: 8 mg/dL (ref 6–20)
Bilirubin Total: 0.5 mg/dL (ref 0.0–1.2)
CO2: 20 mmol/L (ref 20–29)
Calcium: 9.7 mg/dL (ref 8.7–10.2)
Chloride: 102 mmol/L (ref 96–106)
Creatinine, Ser: 0.75 mg/dL (ref 0.57–1.00)
Globulin, Total: 2.2 g/dL (ref 1.5–4.5)
Glucose: 75 mg/dL (ref 70–99)
Potassium: 4.7 mmol/L (ref 3.5–5.2)
Sodium: 139 mmol/L (ref 134–144)
Total Protein: 6.9 g/dL (ref 6.0–8.5)
eGFR: 107 mL/min/{1.73_m2} (ref >59)

## 2021-07-27 LAB — HEPATITIS C ANTIBODY: Hep C Virus Ab: <0.1 s/co ratio (ref 0.0–0.9)

## 2021-07-27 LAB — TSH: TSH: 1.95 u[IU]/mL (ref 0.450–4.500)

## 2021-07-27 LAB — HIV ANTIBODY (ROUTINE TESTING W REFLEX): HIV Screen 4th Generation wRfx: NONREACTIVE

## 2022-01-29 ENCOUNTER — Ambulatory Visit
Admission: RE | Admit: 2022-01-29 | Discharge: 2022-01-29 | Disposition: A | Discharge status: Home / Self Care | Source: Ambulatory Visit | Attending: Family Medicine | Admitting: Family Medicine

## 2022-01-29 ENCOUNTER — Ambulatory Visit: Admitting: Family Medicine

## 2022-01-29 ENCOUNTER — Ambulatory Visit
Admission: RE | Admit: 2022-01-29 | Discharge: 2022-01-29 | Disposition: A | Discharge status: Home / Self Care | Attending: Family Medicine | Admitting: Family Medicine

## 2022-01-29 ENCOUNTER — Other Ambulatory Visit (HOSPITAL_COMMUNITY)
Admission: RE | Admit: 2022-01-29 | Discharge: 2022-01-29 | Disposition: A | Discharge status: Home / Self Care | Source: Ambulatory Visit | Attending: Family Medicine | Admitting: Family Medicine

## 2022-01-29 ENCOUNTER — Encounter: Payer: Self-pay | Admitting: Family Medicine

## 2022-01-29 VITALS — BP 126/78 | HR 59 | Temp 98.7°F | Wt 117.0 lb

## 2022-01-29 DIAGNOSIS — M546 Pain in thoracic spine: Secondary | ICD-10-CM

## 2022-01-29 DIAGNOSIS — Z124 Encounter for screening for malignant neoplasm of cervix: Secondary | ICD-10-CM

## 2022-01-29 DIAGNOSIS — Z803 Family history of malignant neoplasm of breast: Secondary | ICD-10-CM

## 2022-01-29 DIAGNOSIS — Z1231 Encounter for screening mammogram for malignant neoplasm of breast: Secondary | ICD-10-CM | POA: Diagnosis not present

## 2022-01-29 DIAGNOSIS — G8929 Other chronic pain: Secondary | ICD-10-CM | POA: Diagnosis present

## 2022-01-29 NOTE — Assessment & Plan Note (Signed)
Will get her set up for genetics counseling and mammogram. May need MRI based on age and dense breasts.

## 2022-01-29 NOTE — Progress Notes (Signed)
BP 126/78   Pulse (!) 59   Temp 98.7 F (37.1 C) (Oral)   Wt 117 lb (53.1 kg)   LMP 01/03/2022 (Approximate)   SpO2 98%   BMI 23.24 kg/m    Subjective:    Patient ID: Shelby Acosta, female    DOB: 07-May-1986, 36 y.o.   MRN: [id number]  HPI: Shelby Acosta is a 36 y.o. female  Chief Complaint  Patient presents with   Gynecologic Exam   BACK PAIN Duration: couple of years Mechanism of injury: no trauma Location: L>R and upper back Onset: unsure Severity: mild Quality: ache Frequency: intermittent Radiation: none Aggravating factors: taking her bra off  Alleviating factors: rest Status: worse Treatments attempted: rest  Relief with NSAIDs?: No NSAIDs Taken Nighttime pain:  no Paresthesias / decreased sensation:  no Bowel / bladder incontinence:  no Fevers:  no Dysuria / urinary frequency:  no  Has a strong family history of breast cancer with 2 paternal aunts and 4 cousins being diagnosed- youngest at 72. Would like to be screened.   Relevant past medical, surgical, family and social history reviewed and updated as indicated. Interim medical history since our last visit reviewed. Allergies and medications reviewed and updated.  Review of Systems  Constitutional: Negative.   HENT: Negative.    Respiratory: Negative.    Cardiovascular: Negative.   Musculoskeletal:  Positive for back pain. Negative for arthralgias, gait problem, joint swelling, myalgias, neck pain and neck stiffness.  Skin: Negative.   Neurological: Negative.   Psychiatric/Behavioral: Negative.  Negative for agitation, behavioral problems, confusion, decreased concentration, dysphoric mood, hallucinations, self-injury, sleep disturbance and suicidal ideas. The patient is not nervous/anxious and is not hyperactive.     Per HPI unless specifically indicated above     Objective:    BP 126/78   Pulse (!) 59   Temp 98.7 F (37.1 C) (Oral)   Wt 117 lb (53.1 kg)   LMP 01/03/2022  (Approximate)   SpO2 98%   BMI 23.24 kg/m   Wt Readings from Last 3 Encounters:  01/29/22 117 lb (53.1 kg)  07/26/21 122 lb 9.6 oz (55.6 kg)  12/16/15 125 lb (56.7 kg)    Physical Exam Vitals and nursing note reviewed. Exam conducted with a chaperone present.  Constitutional:      General: She is not in acute distress.    Appearance: Normal appearance. She is not ill-appearing, toxic-appearing or diaphoretic.  HENT:     Head: Normocephalic and atraumatic.     Right Ear: External ear normal.     Left Ear: External ear normal.     Nose: Nose normal.     Mouth/Throat:     Mouth: Mucous membranes are moist.     Pharynx: Oropharynx is clear.  Eyes:     General: No scleral icterus.       Right eye: No discharge.        Left eye: No discharge.     Extraocular Movements: Extraocular movements intact.     Conjunctiva/sclera: Conjunctivae normal.     Pupils: Pupils are equal, round, and reactive to light.  Cardiovascular:     Rate and Rhythm: Normal rate and regular rhythm.     Pulses: Normal pulses.     Heart sounds: Normal heart sounds. No murmur heard.    No friction rub. No gallop.  Pulmonary:     Effort: Pulmonary effort is normal. No respiratory distress.     Breath sounds: Normal breath sounds. No stridor. No  wheezing, rhonchi or rales.  Chest:     Chest wall: No tenderness.  Breasts:    Right: Normal.     Left: Normal.  Genitourinary:    General: Normal vulva.     Labia:        Right: No rash, tenderness, lesion or injury.        Left: No rash, tenderness, lesion or injury.      Urethra: No prolapse, urethral pain, urethral swelling or urethral lesion.     Cervix: Normal.     Uterus: Normal.   Musculoskeletal:        General: Normal range of motion.     Cervical back: Normal range of motion and neck supple.  Skin:    General: Skin is warm and dry.     Capillary Refill: Capillary refill takes less than 2 seconds.     Coloration: Skin is not jaundiced or pale.      Findings: No bruising, erythema, lesion or rash.  Neurological:     General: No focal deficit present.     Mental Status: She is alert and oriented to person, place, and time. Mental status is at baseline.  Psychiatric:        Mood and Affect: Mood normal.        Behavior: Behavior normal.        Thought Content: Thought content normal.        Judgment: Judgment normal.     Results for orders placed or performed in visit on 07/26/21  CBC with Differential/Platelet  Result Value Ref Range   WBC 3.3 (L) 3.4 - 10.8 x10E3/uL   RBC 4.90 3.77 - 5.28 x10E6/uL   Hemoglobin 13.9 11.1 - 15.9 g/dL   Hematocrit 43.2 34.0 - 46.6 %   MCV 88 79 - 97 fL   MCH 28.4 26.6 - 33.0 pg   MCHC 32.2 31.5 - 35.7 g/dL   RDW 12.8 11.7 - 15.4 %   Platelets 294 150 - 450 x10E3/uL   Neutrophils 46 Not Estab. %   Lymphs 39 Not Estab. %   Monocytes 13 Not Estab. %   Eos 1 Not Estab. %   Basos 1 Not Estab. %   Neutrophils Absolute 1.5 1.4 - 7.0 x10E3/uL   Lymphocytes Absolute 1.3 0.7 - 3.1 x10E3/uL   Monocytes Absolute 0.4 0.1 - 0.9 x10E3/uL   EOS (ABSOLUTE) 0.0 0.0 - 0.4 x10E3/uL   Basophils Absolute 0.0 0.0 - 0.2 x10E3/uL   Immature Granulocytes 0 Not Estab. %   Immature Grans (Abs) 0.0 0.0 - 0.1 x10E3/uL  Comprehensive metabolic panel  Result Value Ref Range   Glucose 75 70 - 99 mg/dL   BUN 8 6 - 20 mg/dL   Creatinine, Ser 0.75 0.57 - 1.00 mg/dL   eGFR 107 >59 mL/min/1.73   BUN/Creatinine Ratio 11 9 - 23   Sodium 139 134 - 144 mmol/L   Potassium 4.7 3.5 - 5.2 mmol/L   Chloride 102 96 - 106 mmol/L   CO2 20 20 - 29 mmol/L   Calcium 9.7 8.7 - 10.2 mg/dL   Total Protein 6.9 6.0 - 8.5 g/dL   Albumin 4.7 3.8 - 4.8 g/dL   Globulin, Total 2.2 1.5 - 4.5 g/dL   Albumin/Globulin Ratio 2.1 1.2 - 2.2   Bilirubin Total 0.5 0.0 - 1.2 mg/dL   Alkaline Phosphatase 48 44 - 121 IU/L   AST 23 0 - 40 IU/L   ALT 15 0 - 32 IU/L  Lipid  Panel w/o Chol/HDL Ratio  Result Value Ref Range   Cholesterol, Total 213 (H)  100 - 199 mg/dL   Triglycerides 67 0 - 149 mg/dL   HDL 82 >39 mg/dL   VLDL Cholesterol Cal 12 5 - 40 mg/dL   LDL Chol Calc (NIH) 119 (H) 0 - 99 mg/dL  Urinalysis, Routine w reflex microscopic  Result Value Ref Range   Specific Gravity, UA 1.025 1.005 - 1.030   pH, UA 6.5 5.0 - 7.5   Color, UA Yellow Yellow   Appearance Ur Clear Clear   Leukocytes,UA Negative Negative   Protein,UA Trace (A) Negative/Trace   Glucose, UA Negative Negative   Ketones, UA Negative Negative   RBC, UA Negative Negative   Bilirubin, UA Negative Negative   Urobilinogen, Ur 1.0 0.2 - 1.0 mg/dL   Nitrite, UA Negative Negative  TSH  Result Value Ref Range   TSH 1.950 0.450 - 4.500 uIU/mL  Hepatitis C Antibody  Result Value Ref Range   Hep C Virus Ab <0.1 0.0 - 0.9 s/co ratio  HIV Antibody (routine testing w rflx)  Result Value Ref Range   HIV Screen 4th Generation wRfx Non Reactive Non Reactive      Assessment & Plan:   Problem List Items Addressed This Visit       Other   Family history of breast cancer    Will get her set up for genetics counseling and mammogram. May need MRI based on age and dense breasts.       Relevant Orders   Ambulatory referral to Genetics   MM 3D SCREEN BREAST BILATERAL   Other Visit Diagnoses     Chronic left-sided thoracic back pain    -  Primary   Will get her set up for x-ray and stretches given. Call if not getting better or getting worse.    Relevant Orders   DG Thoracic Spine W/Swimmers   Encounter for screening mammogram for malignant neoplasm of breast       Mammogram ordered today. May need MRI.   Relevant Orders   MM 3D SCREEN BREAST BILATERAL   Screening for cervical cancer       Pap done today.   Relevant Orders   Cytology - PAP        Follow up plan: Return in about 6 months (around 08/01/2022) for physical.

## 2022-01-29 NOTE — Patient Instructions (Signed)
Please call to schedule your mammogram: [facility]  Address: [address], [place], [place] [address] Phone: [phone]  

## 2022-01-30 ENCOUNTER — Other Ambulatory Visit: Payer: Self-pay | Admitting: Family Medicine

## 2022-01-30 DIAGNOSIS — M41124 Adolescent idiopathic scoliosis, thoracic region: Secondary | ICD-10-CM

## 2022-02-01 LAB — CYTOLOGY - PAP
Comment: NEGATIVE
Diagnosis: NEGATIVE
High risk HPV: NEGATIVE

## 2022-02-13 ENCOUNTER — Inpatient Hospital Stay

## 2022-02-13 ENCOUNTER — Inpatient Hospital Stay: Attending: Oncology | Admitting: Licensed Clinical Social Worker

## 2022-02-13 ENCOUNTER — Encounter: Payer: Self-pay | Admitting: Licensed Clinical Social Worker

## 2022-02-13 DIAGNOSIS — Z803 Family history of malignant neoplasm of breast: Secondary | ICD-10-CM

## 2022-02-13 DIAGNOSIS — Z800 Family history of malignant neoplasm of digestive organs: Secondary | ICD-10-CM | POA: Diagnosis not present

## 2022-02-13 NOTE — Progress Notes (Signed)
REFERRING PROVIDER: Valerie Roys, DO [place],  [place] [address]  PRIMARY PROVIDER:  Park Liter P, DO  PRIMARY REASON FOR VISIT:  1. Family history of breast cancer   2. Family history of colon cancer      HISTORY OF PRESENT ILLNESS:   Ms. Siefken, a 36 y.o. female, was seen for a [facility] [place] cancer genetics consultation at the request of Dr. Wynetta Emery due to a family history of breast cancer.  Ms. Kronick presents to clinic today to discuss the possibility of a hereditary predisposition to cancer, genetic testing, and to further clarify her future cancer risks, as well as potential cancer risks for family members.   CANCER HISTORY:  Ms. Vanes is a 36 y.o. female with no personal history of cancer.    RISK FACTORS:  Menarche was at age 72.  OCP use for approximately 0 years.  Ovaries intact: yes.  Hysterectomy: no.  Menopausal status: premenopausal.  HRT use: 0 years. Colonoscopy: no; not examined. Mammogram within the last year: no. Number of breast biopsies: 0. Up to date with pelvic exams: yes.  Past Surgical History:  Procedure Laterality Date   DENTAL SURGERY      FAMILY HISTORY:  We obtained a detailed, 4-generation family history.  Significant diagnoses are listed below: Family History  Problem Relation Age of Onset   Diabetes Mother    Hypertension Father    Colon cancer Maternal Aunt        d. 70s   Breast cancer Paternal Aunt    Breast cancer Paternal Aunt    Breast cancer Paternal Aunt    Breast cancer Cousin        dx 37, unsure if genetic testing   Breast cancer Cousin        paternal cousins x3, unsure age of dx   Ms. Wahlquist has 3 brothers, no cancers.  Ms. Rightmyer's mother is living at 11 with no cancer history. Patient has 6 maternal aunts, 1 uncle. One aunt passed of colon cancer in her 51s. No other known cancers on this side of the family.  Ms. Reis's father is living at 61. Patient does not have a lot of contact with  this side of the family as they are Spanish speaking and she is not. Patient has 3 paternal aunts, all of them have had breast cancer, unknown ages of dx or if they had genetic testing. She also has 4 paternal cousins who have had breast cancer, one recently diagnosed at 8. Patient is unsure if this cousin or other cousins have had genetic testing. No information about paternal grandparents.  Ms. Icenhour is unaware of previous family history of genetic testing for hereditary cancer risks. There is no reported Ashkenazi Jewish ancestry. There is no known consanguinity.    GENETIC COUNSELING ASSESSMENT: Ms. Alatorre is a 36 y.o. female with a family history of breast cancer which is somewhat suggestive of a hereditary cancer syndrome and predisposition to cancer. We, therefore, discussed and recommended the following at today's visit.   DISCUSSION: We discussed that approximately 10% of breast cancer is hereditary. Most cases of hereditary breast cancer are associated with BRCA1/BRCA2 genes, although there are other genes associated with hereditary cancer as well including genes associated with colon cancer, which we see on her mother's side of the family. Cancers and risks are gene specific. We discussed that testing is beneficial for several reasons including knowing about cancer risks, identifying potential screening and risk-reduction options that  may be appropriate, and to understand if other family members could be at risk for cancer and allow them to undergo genetic testing.   We reviewed the characteristics, features and inheritance patterns of hereditary cancer syndromes. We also discussed genetic testing, including the appropriate family members to test, the process of testing, insurance coverage and turn-around-time for results. We discussed the implications of a negative, positive and/or variant of uncertain significant result. We recommended Ms. Stanhope pursue genetic testing for the [facility]  Mult-Cancer+RNA gene panel.   The Multi-Cancer Panel + RNA offered by [facility] includes sequencing and/or deletion duplication testing of the following 84 genes: AIP, ALK, APC, ATM, AXIN2,BAP1,  BARD1, BLM, BMPR1A, BRCA1, BRCA2, BRIP1, CASR, CDC73, CDH1, CDK4, CDKN1B, CDKN1C, CDKN2A (p14ARF), CDKN2A (p16INK4a), CEBPA, CHEK2, CTNNA1, DICER1, DIS3L2, EGFR (c.2369C>T, p.Thr790Met variant only), EPCAM (Deletion/duplication testing only), FH, FLCN, GATA2, GPC3, GREM1 (Promoter region deletion/duplication testing only), HOXB13 (c.251G>A, p.Gly84Glu), HRAS, KIT, MAX, MEN1, MET, MITF (c.952G>A, p.Glu318Lys variant only), MLH1, MSH2, MSH3, MSH6, MUTYH, NBN, NF1, NF2, NTHL1, PALB2, PDGFRA, PHOX2B, PMS2, POLD1, POLE, POT1, PRKAR1A, PTCH1, PTEN, RAD50, RAD51C, RAD51D, RB1, RECQL4, RET, RUNX1, SDHAF2, SDHA (sequence changes only), SDHB, SDHC, SDHD, SMAD4, SMARCA4, SMARCB1, SMARCE1, STK11, SUFU, TERC, TERT, TMEM127, TP53, TSC1, TSC2, VHL, WRN and WT1.  Based on Ms. Frid's family history of cancer, she meets medical criteria for genetic testing. Despite that she meets criteria, she may still have an out of pocket cost. We discussed that if her out of pocket cost for testing is over $100, the laboratory will call and confirm whether she wants to proceed with testing.  If the out of pocket cost of testing is less than $100 she will be billed by the genetic testing laboratory.   PLAN: After considering the risks, benefits, and limitations, Ms. Kerchner provided informed consent to pursue genetic testing and the blood sample was sent to [facility] for analysis of the Multi-Cancer+RNA panel. Results should be available within approximately 2-3 weeks' time, at which point they will be disclosed by telephone to Ms. Chabot, as will any additional recommendations warranted by these results. Ms. Nakata will receive a summary of her genetic counseling visit and a copy of her results once available. This information  will also be available in Epic.   Ms. Claar questions were answered to her satisfaction today. Our contact information was provided should additional questions or concerns arise. Thank you for the referral and allowing [place] to share in the care of your patient.   Faith Rogue, MS, [facility] Genetic Counselor [email or web address] Phone: [phone]  The patient was seen for a total of 25 minutes in face-to-face genetic counseling.  Dr. Grayland Ormond was available for discussion regarding this case.   _______________________________________________________________________ For Office Staff:  Number of people involved in session: 1 Was an Intern/ student involved with case: no

## 2022-03-15 ENCOUNTER — Telehealth: Payer: Self-pay | Admitting: Licensed Clinical Social Worker

## 2022-03-21 ENCOUNTER — Encounter: Payer: Self-pay | Admitting: Licensed Clinical Social Worker

## 2022-03-21 ENCOUNTER — Ambulatory Visit: Payer: Self-pay | Admitting: Licensed Clinical Social Worker

## 2022-03-21 DIAGNOSIS — Z1379 Encounter for other screening for genetic and chromosomal anomalies: Secondary | ICD-10-CM

## 2022-03-21 NOTE — Telephone Encounter (Signed)
I contacted Shelby Acosta to discuss her genetic testing results. 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 This result is insufficient to cause autosomal recessive Werner syndrome; however carrier status impacts reproductive risk. No other pathogenic variants were identified in the 84 genes analyzed. Detailed clinic note to follow.   The test report has been scanned into EPIC and is located under the Molecular Pathology section of the Results Review tab.  A portion of the result report is included below for reference.      Lacy Duverney, MS, [facility] Genetic Counselor [email or web address] Phone: [phone]

## 2022-03-21 NOTE — Progress Notes (Signed)
HPI:   Shelby Acosta was previously seen in the [place] clinic due to a family history of breast cancer and concerns regarding a hereditary predisposition to cancer. Please refer to our prior cancer genetics clinic note for more information regarding our discussion, assessment and recommendations, at the time. Shelby Acosta recent genetic test results were disclosed to her, as were recommendations warranted by these results. These results and recommendations are discussed in more detail below.  CANCER HISTORY:  Oncology History   No history exists.    FAMILY HISTORY:  We obtained a detailed, 4-generation family history.  Significant diagnoses are listed below: Family History  Problem Relation Age of Onset   Diabetes Mother    Hypertension Father    Colon cancer Maternal Aunt        d. 79s   Breast cancer Paternal Aunt    Breast cancer Paternal Aunt    Breast cancer Paternal Aunt    Breast cancer Cousin        dx 37, unsure if genetic testing   Breast cancer Cousin        paternal cousins x3, unsure age of dx   Shelby Acosta has 3 brothers, no cancers.   Shelby Acosta's mother is living at 27 with no cancer history. Patient has 6 maternal aunts, 1 uncle. One aunt passed of colon cancer in her 90s. No other known cancers on this side of the family.   Shelby Acosta's father is living at 91. Patient does not have a lot of contact with this side of the family as they are Spanish speaking and she is not. Patient has 3 paternal aunts, all of them have had breast cancer, unknown ages of dx or if they had genetic testing. She also has 4 paternal cousins who have had breast cancer, one recently diagnosed at 79. Patient is unsure if this cousin or other cousins have had genetic testing. No information about paternal grandparents.   Shelby Acosta is unaware of previous family history of genetic testing for hereditary cancer risks. There is no reported Ashkenazi Jewish ancestry.  There is no known consanguinity.        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 This result is insufficient to cause autosomal recessive Werner syndrome; however carrier status impacts reproductive risk. No other pathogenic variants were identified in the 84 genes analyzed.  The Multi-Cancer Panel + RNA offered by [facility] includes sequencing and/or deletion duplication testing of the following 84 genes: AIP, ALK, APC, ATM, AXIN2,BAP1,  BARD1, BLM, BMPR1A, BRCA1, BRCA2, BRIP1, CASR, CDC73, CDH1, CDK4, CDKN1B, CDKN1C, CDKN2A (p14ARF), CDKN2A (p16INK4a), CEBPA, CHEK2, CTNNA1, DICER1, DIS3L2, EGFR (c.2369C>T, p.Thr790Met variant only), EPCAM (Deletion/duplication testing only), FH, FLCN, GATA2, GPC3, GREM1 (Promoter region deletion/duplication testing only), HOXB13 (c.251G>A, p.Gly84Glu), HRAS, KIT, MAX, MEN1, MET, MITF (c.952G>A, p.Glu318Lys variant only), MLH1, MSH2, MSH3, MSH6, MUTYH, NBN, NF1, NF2, NTHL1, PALB2, PDGFRA, PHOX2B, PMS2, POLD1, POLE, POT1, PRKAR1A, PTCH1, PTEN, RAD50, RAD51C, RAD51D, RB1, RECQL4, RET, RUNX1, SDHAF2, SDHA (sequence changes only), SDHB, SDHC, SDHD, SMAD4, SMARCA4, SMARCB1, SMARCE1, STK11, SUFU, TERC, TERT, TMEM127, TP53, TSC1, TSC2, VHL, WRN and WT1.  The test report has been scanned into EPIC and is located under the Molecular Pathology section of the Results Review tab.  A portion of the result report is included below for reference. Genetic testing reported out  on 03/15/2022.       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 This result is insufficient to cause autosomal recessive Werner syndrome; however, carrier status does impact reproductive risk. WS is a progeroid syndrome  characterized by premature aging affecting multiple organ systems. Affected individuals have increased predisposition to age-related diseases such as cataracts, cardiovascular disease, and certain cancers  Sarcoma and other rare cancers occur more frequently; however, more common cancers have also been described  Biological relatives have a chance of being a carrier for or being at risk for autosomal recessive Werner syndrome. Testing should be considered if clinically appropriate. The chance of having a child with autosomal recessive Werner syndrome depends on the carrier state of this individual's partner.  Even though a pathogenic variant  related to the breast cancer in the family was not identified, possible explanations for the cancer in the family may include: There may be no hereditary risk for cancer in the family. The cancers in her family may be sporadic/familial or due to other genetic and environmental factors. There may be a gene mutation in one of these genes that current testing methods cannot detect but that chance is small. There could be another gene that has not yet been discovered, or that we have not yet tested, that is responsible for the cancer diagnoses in the family.  It is also possible there is a hereditary cause for the cancer in the family that Shelby Acosta did not inherit.  Therefore, it is important to remain in touch with cancer genetics in the future so that we can continue to offer Shelby Acosta the most up to date genetic testing.   ADDITIONAL GENETIC TESTING:  We discussed with Shelby Acosta that her genetic testing was fairly extensive.  If there are additional relevant genes identified to increase cancer risk that can be analyzed in the future, we would be happy to discuss and coordinate this testing at that time.    CANCER SCREENING RECOMMENDATIONS:  Shelby Acosta test result is considered negative (normal).  This means that we have not identified a hereditary  cause for her family history of cancer at this time.   An individual's cancer risk and medical management are not determined by genetic test results alone. Overall cancer risk assessment incorporates additional factors, including personal medical history, family history, and any available genetic information that may result in a personalized plan for cancer prevention and surveillance. Therefore, it is recommended she continue to follow the cancer management and screening guidelines provided by her  primary healthcare provider.  Based on the reported personal and family history, specific cancer screenings for Shelby Acosta and her family include:   Breast Cancer Screening:  The Tyrer-Cuzick model is one of multiple prediction models developed to estimate an individual's lifetime risk of developing breast cancer. The Tyrer-Cuzick model is endorsed by the [employer]  (NCCN). This model includes many risk factors such as family history, endogenous estrogen exposure, and benign breast disease. The calculation is highly-dependent on the accuracy of clinical data provided by the patient and can change over time. The Tyrer-Cuzick model may be repeated to reflect new information in her personal or family history in the future.    Shelby Acosta Tyrer-Cuzick risk score is 20.1%.  For women with a greater than 20% lifetime risk of breast cancer, the NCCN recommends the following:    1.  Clinical encounter every 6-12 months to begin when identified as being at increased risk, but not before age 88    2.   Annual mammograms, tomosynthesis is recommended starting 10 years earlier than the youngest breast cancer diagnosis in the family or at age 19 (whichever comes first), but not before age 15     3.   Annual breast MRI starting 10 years earlier than the youngest breast cancer diagnosis in the family or at age 48 (whichever comes first), but not before age 35   We have placed a  referral for [facility].     RECOMMENDATIONS FOR FAMILY MEMBERS:   Individuals in this family might be at some increased risk of developing cancer, over the general population risk, due to the family history of cancer.  Individuals in the family should notify their providers of the family history of cancer. We recommend women in this family have a yearly mammogram beginning at age 60, or 31 years younger than the earliest onset of cancer, an annual clinical breast exam, and perform monthly breast self-exams.  Family members should have colonoscopies by at age 78, or earlier, as recommended by their providers. Other members of the family may still carry a pathogenic variant in one of these genes that Ms. Zuver did not inherit. Based on the family history, we recommend her maternal relatives, especially those who have had breast cancer, have genetic counseling and testing. Ms. Presutti will let us know if we can be of any assistance in coordinating genetic counseling and/or testing for this family member.   Family members can have testing for the WRN pathogenic variant identified in Ms. Kunzman as this can impact reproductive risks.   FOLLOW-UP:  Lastly, we discussed with Ms. Caraveo that cancer genetics is a rapidly advancing field and it is possible that new genetic tests will be appropriate for her and/or her family members in the future. We encouraged her to remain in contact with cancer genetics on an annual basis so we can update her personal and family histories and let her know of advances in cancer genetics that may benefit this family.   Our contact number was provided. Ms. Oshel questions were answered to her satisfaction, and she knows she is welcome to call us at anytime with additional questions or concerns.    Faith Rogue, MS, [facility] Genetic Counselor [email or web address] Phone: [phone]

## 2022-04-10 ENCOUNTER — Encounter: Payer: Self-pay | Admitting: Oncology

## 2022-04-10 ENCOUNTER — Inpatient Hospital Stay: Attending: Oncology | Admitting: Oncology

## 2022-04-10 ENCOUNTER — Inpatient Hospital Stay

## 2022-04-10 VITALS — BP 123/77 | HR 55 | Temp 98.2°F | Resp 16 | Ht 59.0 in | Wt 122.6 lb

## 2022-04-10 DIAGNOSIS — Z8249 Family history of ischemic heart disease and other diseases of the circulatory system: Secondary | ICD-10-CM | POA: Diagnosis not present

## 2022-04-10 DIAGNOSIS — Z1501 Genetic susceptibility to malignant neoplasm of breast: Secondary | ICD-10-CM | POA: Diagnosis present

## 2022-04-10 DIAGNOSIS — Z800 Family history of malignant neoplasm of digestive organs: Secondary | ICD-10-CM | POA: Insufficient documentation

## 2022-04-10 DIAGNOSIS — Z833 Family history of diabetes mellitus: Secondary | ICD-10-CM | POA: Insufficient documentation

## 2022-04-10 DIAGNOSIS — Z803 Family history of malignant neoplasm of breast: Secondary | ICD-10-CM | POA: Diagnosis present

## 2022-04-19 NOTE — Progress Notes (Signed)
Hematology/Oncology Consult note [facility] [phone] [phone]  Patient Care Team: Valerie Roys, DO as PCP - General (Family Medicine)   Name of the patient: Shelby Acosta  [id number]  Aug 16, 1985    Reason for referral-high risk for breast cancer   Referring physician-Breanna Luther Parody  Date of visit: 04/19/22   History of presenting illness- Patient is a 36 year old female with a past medical history significant for colon cancer in her maternal aunt in her 7s.  55 of her paternal aunts have breast cancer.  2 paternal cousins also have breast cancer.  She was therefore seen by genetic counseling and underwent a comprehensive genetic panel which showed a single pathogenic variant in [place] C.1105 C >T.  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  He does not have any particular increased risk of breast cancer.  However her Tyrer-Cuzick risk score was 20.1% and therefore patient has been referred to high-risk breast clinic.  ECOG PS- 0  Pain scale- 0   Review of systems- Review of Systems  Constitutional:  Negative for chills, fever, malaise/fatigue and weight loss.  HENT:  Negative for congestion, ear discharge and nosebleeds.   Eyes:  Negative for blurred vision.  Respiratory:  Negative for cough, hemoptysis, sputum production, shortness of breath and wheezing.   Cardiovascular:  Negative for chest pain, palpitations, orthopnea and claudication.  Gastrointestinal:  Negative for abdominal pain, blood in stool, constipation, diarrhea, heartburn, melena, nausea and vomiting.  Genitourinary:  Negative for dysuria, flank pain, frequency, hematuria and urgency.  Musculoskeletal:  Negative for back pain, joint pain and myalgias.  Skin:  Negative for rash.  Neurological:  Negative for dizziness, tingling, focal weakness, seizures, weakness and headaches.  Endo/Heme/Allergies:  Does not bruise/bleed easily.   Psychiatric/Behavioral:  Negative for depression and suicidal ideas. The patient does not have insomnia.     No Known Allergies  Patient Active Problem List   Diagnosis Date Noted   Genetic testing 03/21/2022   Family history of breast cancer 01/29/2022     No past medical history on file.   Past Surgical History:  Procedure Laterality Date   DENTAL SURGERY      Social History   Socioeconomic History   Marital status: Single    Spouse name: Not on file   Number of children: Not on file   Years of education: Not on file   Highest education level: Not on file  Occupational History   Not on file  Tobacco Use   Smoking status: Former    Types: Cigarettes    Quit date: 11/2018    Years since quitting: 3.4   Smokeless tobacco: Never  Vaping Use   Vaping Use: Never used  Substance and Sexual Activity   Alcohol use: Yes    Comment: on occasion very rare   Drug use: Yes    Types: Marijuana    Comment: on occasion   Sexual activity: Yes    Birth control/protection: None  Other Topics Concern   Not on file  Social History Narrative   Not on file   Social Determinants of Health   Financial Resource Strain: Not on file  Food Insecurity: Not on file  Transportation Needs: Not on file  Physical Activity: Not on file  Stress: Not on file  Social Connections: Not on file  Intimate Partner Violence: Not on file     Family History  Problem Relation Age of Onset   Diabetes Mother  Hypertension Father    Colon cancer Maternal Aunt        d. 40s   Breast cancer Paternal Aunt    Breast cancer Paternal Aunt    Breast cancer Paternal Aunt    Breast cancer Paternal Aunt    Breast cancer Cousin        dx 37, unsure if genetic testing   Breast cancer Cousin        paternal cousins x3, unsure age of dx   Breast cancer Cousin     No current outpatient medications on file.   Physical exam:  Vitals:   04/10/22 1347  BP: 123/77  Pulse: (!) 55  Resp: 16   Temp: 98.2 F (36.8 C)  TempSrc: Oral  Weight: 122 lb 9.6 oz (55.6 kg)  Height: 4\' 11"  (1.499 m)   Physical Exam Cardiovascular:     Rate and Rhythm: Normal rate and regular rhythm.     Heart sounds: Normal heart sounds.  Pulmonary:     Effort: Pulmonary effort is normal.     Breath sounds: Normal breath sounds.  Abdominal:     General: Bowel sounds are normal.     Palpations: Abdomen is soft.  Skin:    General: Skin is warm and dry.  Neurological:     Mental Status: She is alert and oriented to person, place, and time.   Breast exam: No palpable masses in either breast.  No palpable bilateral axillary adenopathy.       Latest Ref Rng & Units 07/26/2021    8:54 AM  CMP  Glucose 70 - 99 mg/dL 75   BUN 6 - 20 mg/dL 8   Creatinine 0.57 - 1.00 mg/dL 0.75   Sodium 134 - 144 mmol/L 139   Potassium 3.5 - 5.2 mmol/L 4.7   Chloride 96 - 106 mmol/L 102   CO2 20 - 29 mmol/L 20   Calcium 8.7 - 10.2 mg/dL 9.7   Total Protein 6.0 - 8.5 g/dL 6.9   Total Bilirubin 0.0 - 1.2 mg/dL 0.5   Alkaline Phos 44 - 121 IU/L 48   AST 0 - 40 IU/L 23   ALT 0 - 32 IU/L 15       Latest Ref Rng & Units 07/26/2021    8:54 AM  CBC  WBC 3.4 - 10.8 x10E3/uL 3.3   Hemoglobin 11.1 - 15.9 g/dL 13.9   Hematocrit 34.0 - 46.6 % 43.2   Platelets 150 - 450 x10E3/uL 294     Assessment and plan- Patient is a 36 y.o. female referred for high risk of breast cancer   Results of geneticTesting were consistent with pathogenic variant noted inWRN called c.1105C>T.  This does not particularly have an increased risk of breast cancer however as per her Tyrer-Cuzick risk model her lifetime risk of breast cancer was 20.1%.  Therefore as per NCCN guidelines she would require clinical breast exams every 2 6 to 12 months.  Her breast exam was unremarkable today.  She would also qualify for annual mammograms and annual breast MRIf her insurance approves.  We discussed pros and cons of getting breast MRI including possible  false positive biopsies.  I will go ahead and schedule her mammogram andAt this time I will see her back in 6 months no labs.There is also a possible role for chemoprevention in patients with lifetime risk of breast cancer at greater than 20%.  However given that her risk is marginally more than 20% presently at 20.1% I  do not recommend chemoprevention in the form of tamoxifen at this time..  Patient verbalized understanding  Thank you for this kind referral and the opportunity to participate in the care of this  Patient   Visit Diagnosis 1. Family history of breast cancer     Dr. Randa Evens, MD, MPH [facility] [id number] 04/19/2022

## 2022-05-03 ENCOUNTER — Ambulatory Visit
Admission: RE | Admit: 2022-05-03 | Discharge: 2022-05-03 | Disposition: A | Discharge status: Home / Self Care | Source: Ambulatory Visit | Attending: Oncology | Admitting: Oncology

## 2022-05-03 DIAGNOSIS — Z803 Family history of malignant neoplasm of breast: Secondary | ICD-10-CM | POA: Diagnosis present

## 2022-05-03 DIAGNOSIS — Z1231 Encounter for screening mammogram for malignant neoplasm of breast: Secondary | ICD-10-CM | POA: Diagnosis not present

## 2022-08-02 ENCOUNTER — Ambulatory Visit (INDEPENDENT_AMBULATORY_CARE_PROVIDER_SITE_OTHER): Admitting: Family Medicine

## 2022-08-02 ENCOUNTER — Encounter: Payer: Self-pay | Admitting: Family Medicine

## 2022-08-02 VITALS — BP 104/72 | HR 60 | Temp 98.3°F | Ht 60.25 in | Wt 125.1 lb

## 2022-08-02 DIAGNOSIS — Z0000 Encounter for general adult medical examination without abnormal findings: Secondary | ICD-10-CM

## 2022-08-02 LAB — URINALYSIS, ROUTINE W REFLEX MICROSCOPIC
Bilirubin, UA: NEGATIVE
Glucose, UA: NEGATIVE
Ketones, UA: NEGATIVE
Leukocytes,UA: NEGATIVE
Nitrite, UA: NEGATIVE
Protein,UA: NEGATIVE
RBC, UA: NEGATIVE
Specific Gravity, UA: 1.015 (ref 1.005–1.030)
Urobilinogen, Ur: 0.2 mg/dL (ref 0.2–1.0)
pH, UA: 7 (ref 5.0–7.5)

## 2022-08-02 NOTE — Progress Notes (Signed)
BP 104/72   Pulse 60   Temp 98.3 F (36.8 C) (Oral)   Ht 5' 0.25" (1.53 m)   Wt 125 lb 1.6 oz (56.7 kg)   SpO2 99%   BMI 24.23 kg/m    Subjective:    Patient ID: Shelby Acosta, female    DOB: [birth year], 37 y.o.   MRN: [id number]  HPI: Shelby Acosta is a 37 y.o. female presenting on 08/02/2022 for comprehensive medical examination. Current medical complaints include:none  Menopausal Symptoms: no  Depression Screen done today and results listed below:     08/02/2022    2:12 PM 01/29/2022    1:12 PM 07/26/2021    8:31 AM  Depression screen PHQ 2/9  Decreased Interest 0 0 0  Down, Depressed, Hopeless 0 0 0  PHQ - 2 Score 0 0 0  Altered sleeping 0 0 0  Tired, decreased energy 0 1 0  Change in appetite 0 0 1  Feeling bad or failure about yourself  0 0 0  Trouble concentrating 1 1 0  Moving slowly or fidgety/restless 0 0 0  Suicidal thoughts 0 0 0  PHQ-9 Score 1 2 1   Difficult doing work/chores Not difficult at all Not difficult at all Not difficult at all    Past Medical History:  History reviewed. No pertinent past medical history.  Surgical History:  Past Surgical History:  Procedure Laterality Date   DENTAL SURGERY      Medications:  No current outpatient medications on file prior to visit.   No current facility-administered medications on file prior to visit.    Allergies:  No Known Allergies  Social History:  Social History   Socioeconomic History   Marital status: Single    Spouse name: Not on file   Number of children: Not on file   Years of education: Not on file   Highest education level: Not on file  Occupational History   Not on file  Tobacco Use   Smoking status: Former    Types: Cigarettes    Quit date: 11/2018    Years since quitting: 3.7   Smokeless tobacco: Never  Vaping Use   Vaping Use: Never used  Substance and Sexual Activity   Alcohol use: Yes    Comment: on occasion very rare   Drug use: Yes    Types: Marijuana     Comment: on occasion   Sexual activity: Yes    Birth control/protection: None  Other Topics Concern   Not on file  Social History Narrative   Not on file   Social Determinants of Health   Financial Resource Strain: Not on file  Food Insecurity: Not on file  Transportation Needs: Not on file  Physical Activity: Not on file  Stress: Not on file  Social Connections: Not on file  Intimate Partner Violence: Not on file   Social History   Tobacco Use  Smoking Status Former   Types: Cigarettes   Quit date: 11/2018   Years since quitting: 3.7  Smokeless Tobacco Never   Social History   Substance and Sexual Activity  Alcohol Use Yes   Comment: on occasion very rare    Family History:  Family History  Problem Relation Age of Onset   Diabetes Mother    Hypertension Father    Colon cancer Maternal Aunt        d. 78s   Breast cancer Paternal Aunt    Breast cancer Paternal Aunt    Breast cancer Paternal  Aunt    Breast cancer Paternal Aunt    Breast cancer Cousin        dx 37, unsure if genetic testing   Breast cancer Cousin        paternal cousins x3, unsure age of dx   Breast cancer Cousin     Past medical history, surgical history, medications, allergies, family history and social history reviewed with patient today and changes made to appropriate areas of the chart.   Review of Systems  Constitutional: Negative.   HENT: Negative.    Eyes:  Positive for blurred vision. Negative for double vision, photophobia, pain, discharge and redness.  Respiratory: Negative.    Cardiovascular: Negative.   Gastrointestinal: Negative.   Genitourinary: Negative.   Musculoskeletal: Negative.   Skin: Negative.   Neurological: Negative.   Endo/Heme/Allergies: Negative.   Psychiatric/Behavioral: Negative.     All other ROS negative except what is listed above and in the HPI.      Objective:    BP 104/72   Pulse 60   Temp 98.3 F (36.8 C) (Oral)   Ht 5' 0.25" (1.53 m)   Wt  125 lb 1.6 oz (56.7 kg)   SpO2 99%   BMI 24.23 kg/m   Wt Readings from Last 3 Encounters:  08/02/22 125 lb 1.6 oz (56.7 kg)  04/10/22 122 lb 9.6 oz (55.6 kg)  01/29/22 117 lb (53.1 kg)    Physical Exam Vitals and nursing note reviewed.  Constitutional:      General: She is not in acute distress.    Appearance: Normal appearance. She is normal weight. She is not ill-appearing, toxic-appearing or diaphoretic.  HENT:     Head: Normocephalic and atraumatic.     Right Ear: Tympanic membrane, ear canal and external ear normal. There is no impacted cerumen.     Left Ear: Tympanic membrane, ear canal and external ear normal. There is no impacted cerumen.     Nose: Nose normal. No congestion or rhinorrhea.     Mouth/Throat:     Mouth: Mucous membranes are moist.     Pharynx: Oropharynx is clear. No oropharyngeal exudate or posterior oropharyngeal erythema.  Eyes:     General: No scleral icterus.       Right eye: No discharge.        Left eye: No discharge.     Extraocular Movements: Extraocular movements intact.     Conjunctiva/sclera: Conjunctivae normal.     Pupils: Pupils are equal, round, and reactive to light.  Neck:     Vascular: No carotid bruit.  Cardiovascular:     Rate and Rhythm: Normal rate and regular rhythm.     Pulses: Normal pulses.     Heart sounds: No murmur heard.    No friction rub. No gallop.  Pulmonary:     Effort: Pulmonary effort is normal. No respiratory distress.     Breath sounds: Normal breath sounds. No stridor. No wheezing, rhonchi or rales.  Chest:     Chest wall: No tenderness.  Abdominal:     General: Abdomen is flat. Bowel sounds are normal. There is no distension.     Palpations: Abdomen is soft. There is no mass.     Tenderness: There is no abdominal tenderness. There is no right CVA tenderness, left CVA tenderness, guarding or rebound.     Hernia: No hernia is present.  Genitourinary:    Comments: Breast and pelvic exams deferred with shared  decision making Musculoskeletal:  General: No swelling, tenderness, deformity or signs of injury.     Cervical back: Normal range of motion and neck supple. No rigidity. No muscular tenderness.     Right lower leg: No edema.     Left lower leg: No edema.  Lymphadenopathy:     Cervical: No cervical adenopathy.  Skin:    General: Skin is warm and dry.     Capillary Refill: Capillary refill takes less than 2 seconds.     Coloration: Skin is not jaundiced or pale.     Findings: No bruising, erythema, lesion or rash.  Neurological:     General: No focal deficit present.     Mental Status: She is alert and oriented to person, place, and time. Mental status is at baseline.     Cranial Nerves: No cranial nerve deficit.     Sensory: No sensory deficit.     Motor: No weakness.     Coordination: Coordination normal.     Gait: Gait normal.     Deep Tendon Reflexes: Reflexes normal.  Psychiatric:        Mood and Affect: Mood normal.        Behavior: Behavior normal.        Thought Content: Thought content normal.        Judgment: Judgment normal.     Results for orders placed or performed in visit on 01/29/22  Cytology - PAP  Result Value Ref Range   High risk HPV Negative    Adequacy      Satisfactory for evaluation; transformation zone component PRESENT.   Diagnosis      - Negative for intraepithelial lesion or malignancy (NILM)   Comment Normal Reference Range HPV - Negative       Assessment & Plan:   Problem List Items Addressed This Visit   None Visit Diagnoses     Routine general medical examination at a health care facility    -  Primary   Vaccines up to date/declined. Screening labs checked today. Pap and mammo up to date. Continue diet and exercise. Call with any concerns.   Relevant Orders   CBC with Differential/Platelet   Comprehensive metabolic panel   Lipid Panel w/o Chol/HDL Ratio   Urinalysis, Routine w reflex microscopic   TSH        Follow up  plan: Return in about 1 year (around 08/03/2023) for physical.   LABORATORY TESTING:  - Pap smear: up to date  IMMUNIZATIONS:   - Tdap: Tetanus vaccination status reviewed: last tetanus booster within 10 years. - Influenza: Refused - Pneumovax: Not applicable - Prevnar: Not applicable - COVID: Not applicable  SCREENING: -Mammogram: Up to date   PATIENT COUNSELING:   Advised to take 1 mg of folate supplement per day if capable of pregnancy.   Sexuality: Discussed sexually transmitted diseases, partner selection, use of condoms, avoidance of unintended pregnancy  and contraceptive alternatives.   Advised to avoid cigarette smoking.  I discussed with the patient that most people either abstain from alcohol or drink within safe limits (<=14/week and <=4 drinks/occasion for males, <=7/weeks and <= 3 drinks/occasion for females) and that the risk for alcohol disorders and other health effects rises proportionally with the number of drinks per week and how often a drinker exceeds daily limits.  Discussed cessation/primary prevention of drug use and availability of treatment for abuse.   Diet: Encouraged to adjust caloric intake to maintain  or achieve ideal body weight, to reduce intake of  dietary saturated fat and total fat, to limit sodium intake by avoiding high sodium foods and not adding table salt, and to maintain adequate dietary potassium and calcium preferably from fresh fruits, vegetables, and low-fat dairy products.    stressed the importance of regular exercise  Injury prevention: Discussed safety belts, safety helmets, smoke detector, smoking near bedding or upholstery.   Dental health: Discussed importance of regular tooth brushing, flossing, and dental visits.    NEXT PREVENTATIVE PHYSICAL DUE IN 1 YEAR. Return in about 1 year (around 08/03/2023) for physical.

## 2022-08-03 LAB — LIPID PANEL W/O CHOL/HDL RATIO
Cholesterol, Total: 227 mg/dL — ABNORMAL HIGH (ref 100–199)
HDL: 88 mg/dL (ref >39)
LDL Chol Calc (NIH): 129 mg/dL — ABNORMAL HIGH (ref 0–99)
Triglycerides: 61 mg/dL (ref 0–149)
VLDL Cholesterol Cal: 10 mg/dL (ref 5–40)

## 2022-08-03 LAB — COMPREHENSIVE METABOLIC PANEL
ALT: 33 IU/L — ABNORMAL HIGH (ref 0–32)
AST: 26 IU/L (ref 0–40)
Albumin/Globulin Ratio: 2.9 — ABNORMAL HIGH (ref 1.2–2.2)
Albumin: 5.2 g/dL — ABNORMAL HIGH (ref 3.9–4.9)
Alkaline Phosphatase: 43 IU/L — ABNORMAL LOW (ref 44–121)
BUN/Creatinine Ratio: 12 (ref 9–23)
BUN: 8 mg/dL (ref 6–20)
Bilirubin Total: 0.4 mg/dL (ref 0.0–1.2)
CO2: 20 mmol/L (ref 20–29)
Calcium: 10.1 mg/dL (ref 8.7–10.2)
Chloride: 101 mmol/L (ref 96–106)
Creatinine, Ser: 0.67 mg/dL (ref 0.57–1.00)
Globulin, Total: 1.8 g/dL (ref 1.5–4.5)
Glucose: 83 mg/dL (ref 70–99)
Potassium: 4.3 mmol/L (ref 3.5–5.2)
Sodium: 137 mmol/L (ref 134–144)
Total Protein: 7 g/dL (ref 6.0–8.5)
eGFR: 117 mL/min/{1.73_m2} (ref >59)

## 2022-08-03 LAB — CBC WITH DIFFERENTIAL/PLATELET
Basophils Absolute: 0 10*3/uL (ref 0.0–0.2)
Basos: 1 %
EOS (ABSOLUTE): 0 10*3/uL (ref 0.0–0.4)
Eos: 0 %
Hematocrit: 43.3 % (ref 34.0–46.6)
Hemoglobin: 14.5 g/dL (ref 11.1–15.9)
Immature Grans (Abs): 0 10*3/uL (ref 0.0–0.1)
Immature Granulocytes: 0 %
Lymphocytes Absolute: 1.3 10*3/uL (ref 0.7–3.1)
Lymphs: 45 %
MCH: 28.4 pg (ref 26.6–33.0)
MCHC: 33.5 g/dL (ref 31.5–35.7)
MCV: 85 fL (ref 79–97)
Monocytes Absolute: 0.4 10*3/uL (ref 0.1–0.9)
Monocytes: 13 %
Neutrophils Absolute: 1.2 10*3/uL — ABNORMAL LOW (ref 1.4–7.0)
Neutrophils: 41 %
Platelets: 260 10*3/uL (ref 150–450)
RBC: 5.11 x10E6/uL (ref 3.77–5.28)
RDW: 13 % (ref 11.7–15.4)
WBC: 2.9 10*3/uL — ABNORMAL LOW (ref 3.4–10.8)

## 2022-08-03 LAB — TSH: TSH: 1.62 u[IU]/mL (ref 0.450–4.500)

## 2022-10-09 ENCOUNTER — Encounter: Payer: Self-pay | Admitting: Oncology

## 2022-10-09 ENCOUNTER — Inpatient Hospital Stay: Attending: Oncology | Admitting: Oncology

## 2022-10-09 VITALS — BP 114/68 | HR 58 | Temp 96.1°F | Resp 16 | Ht 59.0 in | Wt 125.4 lb

## 2022-10-09 DIAGNOSIS — Z87891 Personal history of nicotine dependence: Secondary | ICD-10-CM | POA: Insufficient documentation

## 2022-10-09 DIAGNOSIS — Z803 Family history of malignant neoplasm of breast: Secondary | ICD-10-CM | POA: Insufficient documentation

## 2022-10-09 DIAGNOSIS — Z148 Genetic carrier of other disease: Secondary | ICD-10-CM | POA: Diagnosis not present

## 2022-10-09 DIAGNOSIS — Z8249 Family history of ischemic heart disease and other diseases of the circulatory system: Secondary | ICD-10-CM | POA: Diagnosis not present

## 2022-10-09 DIAGNOSIS — Z800 Family history of malignant neoplasm of digestive organs: Secondary | ICD-10-CM | POA: Insufficient documentation

## 2022-10-09 DIAGNOSIS — Z1379 Encounter for other screening for genetic and chromosomal anomalies: Secondary | ICD-10-CM | POA: Diagnosis not present

## 2022-10-09 DIAGNOSIS — Z833 Family history of diabetes mellitus: Secondary | ICD-10-CM | POA: Insufficient documentation

## 2022-10-09 NOTE — Progress Notes (Signed)
No concerns for the provider. 

## 2022-10-14 NOTE — Progress Notes (Signed)
Hematology/Oncology Consult note [facility]  [phone] [phone]  Patient Care Team: Valerie Roys, DO as PCP - General (Family Medicine)   Name of the patient: Shelby Acosta  [id number]  [birth year]   Date of visit: 10/14/22  Diagnosis-high risk for breast cancer  Chief complaint/ Reason for visit-routine follow-up visit for high risk breast clinic  Heme/Onc history: Patient is a 37 year old female with a past medical history significant for colon cancer in her maternal aunt in her 71s.  39 of her paternal aunts have breast cancer.  2 paternal cousins also have breast cancer.  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  He does not have any particular increased risk of breast cancer.  However her Tyrer-Cuzick risk score was 20.1% and therefore patient has been referred to high-risk breast clinic.  Present plan is to continue with breast exams twice a year and yearly mammograms.  MRIs alternating with mammograms is a consideration if insurance would pay for it.  Interval history-patient is doing well and denies any specific breast concerns at this time  ECOG PS- 0 Pain scale- 0   Review of systems- Review of Systems  Constitutional:  Negative for chills, fever, malaise/fatigue and weight loss.  HENT:  Negative for congestion, ear discharge and nosebleeds.   Eyes:  Negative for blurred vision.  Respiratory:  Negative for cough, hemoptysis, sputum production, shortness of breath and wheezing.   Cardiovascular:  Negative for chest pain, palpitations, orthopnea and claudication.  Gastrointestinal:  Negative for abdominal pain, blood in stool, constipation, diarrhea, heartburn, melena, nausea and vomiting.  Genitourinary:  Negative for dysuria, flank pain,  frequency, hematuria and urgency.  Musculoskeletal:  Negative for back pain, joint pain and myalgias.  Skin:  Negative for rash.  Neurological:  Negative for dizziness, tingling, focal weakness, seizures, weakness and headaches.  Endo/Heme/Allergies:  Does not bruise/bleed easily.  Psychiatric/Behavioral:  Negative for depression and suicidal ideas. The patient does not have insomnia.       No Known Allergies   History reviewed. No pertinent past medical history.   Past Surgical History:  Procedure Laterality Date   DENTAL SURGERY      Social History   Socioeconomic History   Marital status: Single    Spouse name: Not on file   Number of children: Not on file   Years of education: Not on file   Highest education level: Not on file  Occupational History   Not on file  Tobacco Use   Smoking status: Former    Types: Cigarettes    Quit date: 11/2018    Years since quitting: 3.9   Smokeless tobacco: Never  Vaping Use   Vaping Use: Never used  Substance and Sexual Activity   Alcohol use: Yes    Comment: on occasion very rare   Drug use: Yes    Types: Marijuana    Comment: on occasion   Sexual activity: Yes    Birth control/protection: None  Other Topics Concern   Not on file  Social History Narrative   Not on file   Social Determinants of Health   Financial Resource Strain: Not on file  Food Insecurity: Not on file  Transportation Needs: Not on file  Physical Activity: Not on file  Stress: Not on file  Social Connections: Not on  file  Intimate Partner Violence: Not on file    Family History  Problem Relation Age of Onset   Diabetes Mother    Hypertension Father    Colon cancer Maternal Aunt        d. 55s   Breast cancer Paternal Aunt    Breast cancer Paternal Aunt    Breast cancer Paternal Aunt    Breast cancer Paternal Aunt    Breast cancer Cousin        dx 37, unsure if genetic testing   Breast cancer Cousin        paternal cousins x3, unsure age  of dx   Breast cancer Cousin     No current outpatient medications on file.  Physical exam:  Vitals:   10/09/22 1346  BP: 114/68  Pulse: (!) 58  Resp: 16  Temp: (!) 96.1 F (35.6 C)  TempSrc: Tympanic  SpO2: 99%  Weight: 125 lb 6.4 oz (56.9 kg)  Height: 4\' 11"  (1.499 m)   Physical Exam Cardiovascular:     Rate and Rhythm: Normal rate and regular rhythm.     Heart sounds: Normal heart sounds.  Pulmonary:     Effort: Pulmonary effort is normal.     Breath sounds: Normal breath sounds.  Abdominal:     General: Bowel sounds are normal.     Palpations: Abdomen is soft.  Skin:    General: Skin is warm and dry.  Neurological:     Mental Status: She is alert and oriented to person, place, and time.   Breast exam: No palpable masses in either breast.  No palpable bilateral axillary adenopathy.     Latest Ref Rng & Units 08/02/2022    2:14 PM  CMP  Glucose 70 - 99 mg/dL 83   BUN 6 - 20 mg/dL 8   Creatinine 0.57 - 1.00 mg/dL 0.67   Sodium 134 - 144 mmol/L 137   Potassium 3.5 - 5.2 mmol/L 4.3   Chloride 96 - 106 mmol/L 101   CO2 20 - 29 mmol/L 20   Calcium 8.7 - 10.2 mg/dL 10.1   Total Protein 6.0 - 8.5 g/dL 7.0   Total Bilirubin 0.0 - 1.2 mg/dL 0.4   Alkaline Phos 44 - 121 IU/L 43   AST 0 - 40 IU/L 26   ALT 0 - 32 IU/L 33       Latest Ref Rng & Units 08/02/2022    2:14 PM  CBC  WBC 3.4 - 10.8 x10E3/uL 2.9   Hemoglobin 11.1 - 15.9 g/dL 14.5   Hematocrit 34.0 - 46.6 % 43.3   Platelets 150 - 450 x10E3/uL 260      Assessment and plan- Patient is a 37 y.o. female here as a follow-up for high risk breast clinic  Patient does not have any genetic mutations that would increase her risk for breast cancer.  She had a pathogenic variant noted in[place] called c.1105C>T which does not increase the risk of breast cancer.  However as per Tyrer-Cuzick model her lifetime risk of breast cancer was 20.1%.  I therefore recommend is getting yearly mammograms despite age 59.  Will see if  we can alternate mammograms alternating with MRIs if her insurance approves.  Clinically patient is presently doing well with no palpable masses on today's exam.  I will see her back in 6 months no labs   Visit Diagnosis 1. Family history of breast cancer   2. Genetic testing   3. Family history of colon  cancer      Dr. Randa Evens, MD, MPH [facility] at [facility] [id number] 10/14/2022 7:38 PM

## 2023-04-12 ENCOUNTER — Encounter: Payer: Self-pay | Admitting: Oncology

## 2023-04-12 ENCOUNTER — Inpatient Hospital Stay: Attending: Oncology | Admitting: Oncology

## 2023-04-12 VITALS — BP 116/83 | HR 64 | Temp 97.3°F | Resp 16 | Ht 59.0 in | Wt 129.0 lb

## 2023-04-12 DIAGNOSIS — Z8249 Family history of ischemic heart disease and other diseases of the circulatory system: Secondary | ICD-10-CM | POA: Insufficient documentation

## 2023-04-12 DIAGNOSIS — Z833 Family history of diabetes mellitus: Secondary | ICD-10-CM | POA: Diagnosis not present

## 2023-04-12 DIAGNOSIS — Z800 Family history of malignant neoplasm of digestive organs: Secondary | ICD-10-CM | POA: Insufficient documentation

## 2023-04-12 DIAGNOSIS — Z803 Family history of malignant neoplasm of breast: Secondary | ICD-10-CM | POA: Insufficient documentation

## 2023-04-12 DIAGNOSIS — Z85038 Personal history of other malignant neoplasm of large intestine: Secondary | ICD-10-CM | POA: Insufficient documentation

## 2023-04-12 DIAGNOSIS — F1290 Cannabis use, unspecified, uncomplicated: Secondary | ICD-10-CM | POA: Diagnosis not present

## 2023-04-12 DIAGNOSIS — Z9189 Other specified personal risk factors, not elsewhere classified: Secondary | ICD-10-CM

## 2023-04-12 DIAGNOSIS — Z87891 Personal history of nicotine dependence: Secondary | ICD-10-CM | POA: Insufficient documentation

## 2023-04-12 NOTE — Progress Notes (Signed)
Hematology/Oncology Consult note [facility]  [phone] [phone]  Patient Care Team: Dorcas Carrow, DO as PCP - General (Family Medicine)   Name of the patient: Shelby Acosta  [id number]  09-Mar-1986   Date of visit: 04/12/23  Diagnosis- high risk for breast cancer   Chief complaint/ Reason for visit-routine follow-up visit for high risk of breast cancer  Heme/Onc history: Patient is a 37 year old female with a past medical history significant for colon cancer in her maternal aunt in her 86s.  3 of her paternal aunts have breast cancer.  2 paternal cousins also have breast cancer.  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  He does not have any particular increased risk of breast cancer.  However her Tyrer-Cuzick risk score was 20.1% and therefore patient has been referred to high-risk breast clinic.  Present plan is to continue with breast exams twice a year and yearly mammograms.  MRIs alternating with mammograms is a consideration if insurance would pay for it.   Interval history-she is doing well overall and denies any breast concerns at this time  ECOG PS- 0 Pain scale- 0   Review of systems- Review of Systems  Constitutional:  Negative for chills, fever, malaise/fatigue and weight loss.  HENT:  Negative for congestion, ear discharge and nosebleeds.   Eyes:  Negative for blurred vision.  Respiratory:  Negative for cough, hemoptysis, sputum production, shortness of breath and wheezing.   Cardiovascular:  Negative for chest pain, palpitations, orthopnea and claudication.  Gastrointestinal:  Negative for abdominal pain, blood in stool, constipation, diarrhea, heartburn, melena, nausea and vomiting.  Genitourinary:  Negative for dysuria, flank pain,  frequency, hematuria and urgency.  Musculoskeletal:  Negative for back pain, joint pain and myalgias.  Skin:  Negative for rash.  Neurological:  Negative for dizziness, tingling, focal weakness, seizures, weakness and headaches.  Endo/Heme/Allergies:  Does not bruise/bleed easily.  Psychiatric/Behavioral:  Negative for depression and suicidal ideas. The patient does not have insomnia.       No Known Allergies   History reviewed. No pertinent past medical history.   Past Surgical History:  Procedure Laterality Date   DENTAL SURGERY      Social History   Socioeconomic History   Marital status: Single    Spouse name: Not on file   Number of children: Not on file   Years of education: Not on file   Highest education level: Not on file  Occupational History   Not on file  Tobacco Use   Smoking status: Former    Current packs/day: 0.00    Types: Cigarettes    Quit date: 11/2018    Years since quitting: 4.4   Smokeless tobacco: Never  Vaping Use   Vaping status: Never Used  Substance and Sexual Activity   Alcohol use: Yes    Comment: on occasion very rare   Drug use: Yes    Types: Marijuana    Comment: on occasion   Sexual activity: Yes    Birth control/protection: None  Other Topics Concern   Not on file  Social History Narrative   Not on file   Social Determinants of Health   Financial Resource Strain: Not on file  Food Insecurity: Not on file  Transportation Needs: Not on file  Physical Activity: Not on file  Stress: Not on file  Social Connections: Not on file  Intimate Partner Violence: Not on file    Family History  Problem Relation Age of Onset   Diabetes Mother    Hypertension Father    Colon cancer Maternal Aunt        d. 38s   Breast cancer Paternal Aunt    Breast cancer Paternal Aunt    Breast cancer Paternal Aunt    Breast cancer Paternal Aunt    Breast cancer Cousin        dx 51, unsure if genetic testing   Breast cancer Cousin         paternal cousins x3, unsure age of dx   Breast cancer Cousin     No current outpatient medications on file.  Physical exam:  Vitals:   04/12/23 1411  BP: 116/83  Pulse: 64  Resp: 16  Temp: (!) 97.3 F (36.3 C)  TempSrc: Tympanic  SpO2: 100%  Weight: 129 lb (58.5 kg)  Height: 4\' 11"  (1.499 m)   Physical Exam Cardiovascular:     Rate and Rhythm: Normal rate and regular rhythm.     Heart sounds: Normal heart sounds.  Pulmonary:     Effort: Pulmonary effort is normal.  Skin:    General: Skin is warm and dry.  Neurological:     Mental Status: She is alert and oriented to person, place, and time.   Breast exam: No palpable breast masses in either breast.  No palpable bilateral axillary adenopathy.     Latest Ref Rng & Units 08/02/2022    2:14 PM  CMP  Glucose 70 - 99 mg/dL 83   BUN 6 - 20 mg/dL 8   Creatinine 1.61 - 0.96 mg/dL 0.45   Sodium 409 - 811 mmol/L 137   Potassium 3.5 - 5.2 mmol/L 4.3   Chloride 96 - 106 mmol/L 101   CO2 20 - 29 mmol/L 20   Calcium 8.7 - 10.2 mg/dL 91.4   Total Protein 6.0 - 8.5 g/dL 7.0   Total Bilirubin 0.0 - 1.2 mg/dL 0.4   Alkaline Phos 44 - 121 IU/L 43   AST 0 - 40 IU/L 26   ALT 0 - 32 IU/L 33       Latest Ref Rng & Units 08/02/2022    2:14 PM  CBC  WBC 3.4 - 10.8 x10E3/uL 2.9   Hemoglobin 11.1 - 15.9 g/dL 78.2   Hematocrit 95.6 - 46.6 % 43.3   Platelets 150 - 450 x10E3/uL 260      Assessment and plan- Patient is a 37 y.o. female here for routine follow-up given her high risk of breast cancer  As per Tyrer-Cuzick model her lifetime risk of breast cancer is about 20.1%.  Family history of breast cancer in her paternal cousin who was diagnosed at the age of 57.  Her mammogram from September 2023 was unremarkable and I would recommend yearly mammograms at this time given her high risk of breast cancer.  MRIs alternating with mammograms could be pursued if insurance approves.  On exam today her breast exam is unremarkable and I will  see her back in 6 months   Visit Diagnosis 1. Family history of breast cancer   2. At high risk for breast cancer      Dr. Owens Shark, MD, MPH [facility] at [facility] [phone] 04/12/2023 4:25 PM

## 2023-04-15 ENCOUNTER — Encounter: Payer: Self-pay | Admitting: *Deleted

## 2023-04-15 DIAGNOSIS — Z803 Family history of malignant neoplasm of breast: Secondary | ICD-10-CM

## 2023-04-15 DIAGNOSIS — Z800 Family history of malignant neoplasm of digestive organs: Secondary | ICD-10-CM

## 2023-04-15 DIAGNOSIS — Z1379 Encounter for other screening for genetic and chromosomal anomalies: Secondary | ICD-10-CM

## 2023-04-16 NOTE — Progress Notes (Signed)
Breast MRI ordered for April 2025 to alternate with mammograms.   Message sent to MRI scheduler Sharyl Nimrod notifiying of when breast MRI needed to be scheduled.

## 2023-06-11 ENCOUNTER — Ambulatory Visit
Admission: RE | Admit: 2023-06-11 | Discharge: 2023-06-11 | Disposition: A | Discharge status: Home / Self Care | Source: Ambulatory Visit | Attending: Oncology | Admitting: Oncology

## 2023-06-11 DIAGNOSIS — Z803 Family history of malignant neoplasm of breast: Secondary | ICD-10-CM | POA: Diagnosis present

## 2023-06-11 DIAGNOSIS — Z1231 Encounter for screening mammogram for malignant neoplasm of breast: Secondary | ICD-10-CM | POA: Insufficient documentation

## 2023-10-11 ENCOUNTER — Encounter: Payer: Self-pay | Admitting: Oncology

## 2023-10-11 ENCOUNTER — Inpatient Hospital Stay: Attending: Oncology | Admitting: Oncology

## 2023-10-11 VITALS — BP 117/85 | HR 75 | Temp 98.6°F | Resp 19 | Wt 136.6 lb

## 2023-10-11 DIAGNOSIS — Z8249 Family history of ischemic heart disease and other diseases of the circulatory system: Secondary | ICD-10-CM | POA: Diagnosis not present

## 2023-10-11 DIAGNOSIS — Z803 Family history of malignant neoplasm of breast: Secondary | ICD-10-CM | POA: Diagnosis present

## 2023-10-11 DIAGNOSIS — Z87891 Personal history of nicotine dependence: Secondary | ICD-10-CM | POA: Diagnosis not present

## 2023-10-11 DIAGNOSIS — F1290 Cannabis use, unspecified, uncomplicated: Secondary | ICD-10-CM | POA: Insufficient documentation

## 2023-10-11 DIAGNOSIS — Z800 Family history of malignant neoplasm of digestive organs: Secondary | ICD-10-CM | POA: Insufficient documentation

## 2023-10-11 DIAGNOSIS — Z1589 Genetic susceptibility to other disease: Secondary | ICD-10-CM | POA: Insufficient documentation

## 2023-10-11 DIAGNOSIS — Z833 Family history of diabetes mellitus: Secondary | ICD-10-CM | POA: Insufficient documentation

## 2023-10-11 DIAGNOSIS — Z9189 Other specified personal risk factors, not elsewhere classified: Secondary | ICD-10-CM

## 2023-10-12 NOTE — Progress Notes (Signed)
 Hematology/Oncology Consult note [facility]  [phone] [phone]  Patient Care Team: Dorcas Carrow, DO as PCP - General (Family Medicine)   Name of the patient: Shelby Acosta  [id number]  [birth year]   Date of visit: 10/12/23  Diagnosis-high risk for breast cancer  Chief complaint/ Reason for visit-routine visit for breast exam  Heme/Onc history: Patient is a 38 year old female with a past medical history significant for colon cancer in her maternal aunt in her 30s. 3 of her paternal aunts have breast cancer. 2 paternal cousins also have breast cancer. 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 He does not have any particular increased risk of breast cancer. However her Tyrer-Cuzick risk score was 20.1% and therefore patient has been referred to high-risk breast clinic. Present plan is to continue with breast exams yearly and yearly mammograms. MRIs alternating with mammograms is a consideration if insurance would pay for it.   Interval history-she is doing well presently and denies any breast concerns.  She is scheduled for MRI next month  ECOG PS- 0 Pain scale- 0   Review of systems- Review of Systems  Constitutional:  Negative for chills, fever, malaise/fatigue and weight loss.  HENT:  Negative for congestion, ear discharge and nosebleeds.   Eyes:  Negative for blurred vision.  Respiratory:  Negative for cough, hemoptysis, sputum production, shortness of breath and wheezing.   Cardiovascular:  Negative for chest pain, palpitations, orthopnea and claudication.  Gastrointestinal:  Negative for abdominal pain, blood in stool, constipation, diarrhea, heartburn, melena, nausea and vomiting.  Genitourinary:  Negative for dysuria, flank pain, frequency,  hematuria and urgency.  Musculoskeletal:  Negative for back pain, joint pain and myalgias.  Skin:  Negative for rash.  Neurological:  Negative for dizziness, tingling, focal weakness, seizures, weakness and headaches.  Endo/Heme/Allergies:  Does not bruise/bleed easily.  Psychiatric/Behavioral:  Negative for depression and suicidal ideas. The patient does not have insomnia.       No Known Allergies   History reviewed. No pertinent past medical history.   Past Surgical History:  Procedure Laterality Date   DENTAL SURGERY      Social History   Socioeconomic History   Marital status: Single    Spouse name: Not on file   Number of children: Not on file   Years of education: Not on file   Highest education level: Not on file  Occupational History   Not on file  Tobacco Use   Smoking status: Former    Current packs/day: 0.00    Types: Cigarettes    Quit date: 11/2018    Years since quitting: 4.9   Smokeless tobacco: Never  Vaping Use   Vaping status: Never Used  Substance and Sexual Activity   Alcohol use: Yes    Comment: on occasion very rare   Drug use: Yes    Types: Marijuana    Comment: on occasion   Sexual activity: Yes    Birth control/protection: None  Other Topics Concern   Not on file  Social History Narrative   Not on file   Social Drivers of Health   Financial Resource Strain: Not on file  Food Insecurity: Not on file  Transportation Needs: Not on file  Physical Activity: Not on file  Stress: Not on file  Social Connections: Not on file  Intimate Partner Violence: Not on file    Family History  Problem Relation Age of Onset   Diabetes Mother    Hypertension Father    Colon cancer Maternal Aunt        d. 66s   Breast cancer Paternal Aunt    Breast cancer Paternal Aunt    Breast cancer Paternal Aunt    Breast cancer Paternal Aunt    Breast cancer Cousin        dx 37, unsure if genetic testing   Breast cancer Cousin        paternal cousins  x3, unsure age of dx   Breast cancer Cousin     No current outpatient medications on file.  Physical exam:  Vitals:   10/11/23 1407  BP: 117/85  Pulse: 75  Resp: 19  Temp: 98.6 F (37 C)  SpO2: 99%  Weight: 136 lb 9.6 oz (62 kg)   Physical Exam Cardiovascular:     Rate and Rhythm: Normal rate and regular rhythm.     Heart sounds: Normal heart sounds.  Pulmonary:     Effort: Pulmonary effort is normal.     Breath sounds: Normal breath sounds.  Abdominal:     General: Bowel sounds are normal.     Palpations: Abdomen is soft.  Skin:    General: Skin is warm and dry.  Neurological:     Mental Status: She is alert and oriented to person, place, and time.     Breastexam performed in sitting and lying down position.  No palpable bilateral axillary adenopathy.  No palpable masses in bilateral breasts.      Latest Ref Rng & Units 08/02/2022    2:14 PM  CMP  Glucose 70 - 99 mg/dL 83   BUN 6 - 20 mg/dL 8   Creatinine 4.09 - 8.11 mg/dL 9.14   Sodium 782 - 956 mmol/L 137   Potassium 3.5 - 5.2 mmol/L 4.3   Chloride 96 - 106 mmol/L 101   CO2 20 - 29 mmol/L 20   Calcium 8.7 - 10.2 mg/dL 21.3   Total Protein 6.0 - 8.5 g/dL 7.0   Total Bilirubin 0.0 - 1.2 mg/dL 0.4   Alkaline Phos 44 - 121 IU/L 43   AST 0 - 40 IU/L 26   ALT 0 - 32 IU/L 33       Latest Ref Rng & Units 08/02/2022    2:14 PM  CBC  WBC 3.4 - 10.8 x10E3/uL 2.9   Hemoglobin 11.1 - 15.9 g/dL 08.6   Hematocrit 57.8 - 46.6 % 43.3   Platelets 150 - 450 x10E3/uL 260      Assessment and plan- Patient is a 38 y.o. female routine follow-up visit for high risk of breast cancer  Clinically patient is doing well and there is no concerning findings on breast exam today.  She has not had any personal history of breast cancer.  Her mammogram from November 2024 was unremarkable.  We will schedule her yearly mammogram for November 2025.  MRI is also scheduled for next month   Visit Diagnosis 1. At high risk for breast  cancer      Dr. Owens Shark, MD, MPH [facility] [phone] 10/12/2023 1:13 PM

## 2023-10-17 ENCOUNTER — Encounter: Payer: Self-pay | Admitting: Family Medicine

## 2023-10-17 ENCOUNTER — Ambulatory Visit: Admitting: Family Medicine

## 2023-10-17 VITALS — BP 129/84 | HR 62 | Temp 98.0°F | Ht 59.0 in | Wt 134.8 lb

## 2023-10-17 DIAGNOSIS — Z0000 Encounter for general adult medical examination without abnormal findings: Secondary | ICD-10-CM

## 2023-10-17 NOTE — Progress Notes (Signed)
 BP 129/84 (BP Location: Left Arm, Patient Position: Sitting, Cuff Size: Normal)   Pulse 62   Temp 98 F (36.7 C) (Oral)   Ht 4\' 11"  (1.499 m)   Wt 134 lb 12.8 oz (61.1 kg)   SpO2 98%   BMI 27.23 kg/m    Subjective:    Patient ID: Shelby Acosta, female    DOB: 1986/03/28, 38 y.o.   MRN: [id number]  HPI: Shelby Acosta is a 38 y.o. female presenting on 10/17/2023 for comprehensive medical examination. Current medical complaints include:none  Menopausal Symptoms: no  Depression Screen done today and results listed below:     10/17/2023    2:19 PM 10/17/2023    2:16 PM 08/02/2022    2:12 PM 01/29/2022    1:12 PM 07/26/2021    8:31 AM  Depression screen PHQ 2/9  Decreased Interest 0 0 0 0 0  Down, Depressed, Hopeless 0 0 0 0 0  PHQ - 2 Score 0 0 0 0 0  Altered sleeping 0  0 0 0  Tired, decreased energy 1  0 1 0  Change in appetite 1  0 0 1  Feeling bad or failure about yourself  0  0 0 0  Trouble concentrating 1  1 1  0  Moving slowly or fidgety/restless 0  0 0 0  Suicidal thoughts 0  0 0 0  PHQ-9 Score 3  1 2 1   Difficult doing work/chores   Not difficult at all Not difficult at all Not difficult at all    Past Medical History:  History reviewed. No pertinent past medical history.  Surgical History:  Past Surgical History:  Procedure Laterality Date   DENTAL SURGERY      Medications:  No current outpatient medications on file prior to visit.   No current facility-administered medications on file prior to visit.    Allergies:  No Known Allergies  Social History:  Social History   Socioeconomic History   Marital status: Single    Spouse name: Not on file   Number of children: Not on file   Years of education: Not on file   Highest education level: Not on file  Occupational History   Not on file  Tobacco Use   Smoking status: Former    Current packs/day: 0.00    Types: Cigarettes    Quit date: 11/2018    Years since quitting: 4.9   Smokeless tobacco:  Never  Vaping Use   Vaping status: Never Used  Substance and Sexual Activity   Alcohol use: Yes    Comment: on occasion very rare   Drug use: Yes    Types: Marijuana    Comment: on occasion   Sexual activity: Yes    Birth control/protection: None  Other Topics Concern   Not on file  Social History Narrative   Not on file   Social Drivers of Health   Financial Resource Strain: Low Risk  (10/17/2023)   Overall Financial Resource Strain (CARDIA)    Difficulty of Paying Living Expenses: Not hard at all  Food Insecurity: No Food Insecurity (10/17/2023)   Hunger Vital Sign    Worried About Running Out of Food in the Last Year: Never true    Ran Out of Food in the Last Year: Never true  Transportation Needs: No Transportation Needs (10/17/2023)   PRAPARE - Administrator, Civil Service (Medical): No    Lack of Transportation (Non-Medical): No  Physical Activity: Inactive (10/17/2023)  Exercise Vital Sign    Days of Exercise per Week: 0 days    Minutes of Exercise per Session: 0 min  Stress: No Stress Concern Present (10/17/2023)   [employer] of Occupational Health - Occupational Stress Questionnaire    Feeling of Stress : Only a little  Social Connections: Unknown (10/17/2023)   Social Connection and Isolation Panel [NHANES]    Frequency of Communication with Friends and Family: More than three times a week    Frequency of Social Gatherings with Friends and Family: Twice a week    Attends Religious Services: Not on Marketing executive or Organizations: Not on file    Attends Banker Meetings: Not on file    Marital Status: Never married  Intimate Partner Violence: Not At Risk (10/17/2023)   Humiliation, Afraid, Rape, and Kick questionnaire    Fear of Current or Ex-Partner: No    Emotionally Abused: No    Physically Abused: No    Sexually Abused: No   Social History   Tobacco Use  Smoking Status Former   Current packs/day: 0.00   Types:  Cigarettes   Quit date: 11/2018   Years since quitting: 4.9  Smokeless Tobacco Never   Social History   Substance and Sexual Activity  Alcohol Use Yes   Comment: on occasion very rare    Family History:  Family History  Problem Relation Age of Onset   Diabetes Mother    Hypertension Father    Stroke Father    Colon cancer Maternal Aunt        d. 86s   Breast cancer Paternal Aunt    Breast cancer Paternal Aunt    Breast cancer Paternal Aunt    Breast cancer Paternal Aunt    Breast cancer Cousin        dx 37, unsure if genetic testing   Breast cancer Cousin        paternal cousins x3, unsure age of dx   Breast cancer Cousin     Past medical history, surgical history, medications, allergies, family history and social history reviewed with patient today and changes made to appropriate areas of the chart.   Review of Systems  Constitutional: Negative.   Eyes: Negative.   Respiratory: Negative.    Cardiovascular: Negative.   Gastrointestinal: Negative.   Genitourinary: Negative.   Musculoskeletal:  Positive for back pain and myalgias. Negative for falls, joint pain and neck pain.  Skin: Negative.   Neurological: Negative.   Endo/Heme/Allergies:  Negative for environmental allergies and polydipsia. Bruises/bleeds easily.  Psychiatric/Behavioral: Negative.     All other ROS negative except what is listed above and in the HPI.      Objective:    BP 129/84 (BP Location: Left Arm, Patient Position: Sitting, Cuff Size: Normal)   Pulse 62   Temp 98 F (36.7 C) (Oral)   Ht 4\' 11"  (1.499 m)   Wt 134 lb 12.8 oz (61.1 kg)   SpO2 98%   BMI 27.23 kg/m   Wt Readings from Last 3 Encounters:  10/17/23 134 lb 12.8 oz (61.1 kg)  10/11/23 136 lb 9.6 oz (62 kg)  04/12/23 129 lb (58.5 kg)    Physical Exam Vitals and nursing note reviewed.  Constitutional:      General: She is not in acute distress.    Appearance: Normal appearance. She is not ill-appearing, toxic-appearing  or diaphoretic.  HENT:     Head: Normocephalic and atraumatic.  Right Ear: Tympanic membrane, ear canal and external ear normal. There is no impacted cerumen.     Left Ear: Tympanic membrane, ear canal and external ear normal. There is no impacted cerumen.     Nose: Nose normal. No congestion or rhinorrhea.     Mouth/Throat:     Mouth: Mucous membranes are moist.     Pharynx: Oropharynx is clear. No oropharyngeal exudate or posterior oropharyngeal erythema.  Eyes:     General: No scleral icterus.       Right eye: No discharge.        Left eye: No discharge.     Extraocular Movements: Extraocular movements intact.     Conjunctiva/sclera: Conjunctivae normal.     Pupils: Pupils are equal, round, and reactive to light.  Neck:     Vascular: No carotid bruit.  Cardiovascular:     Rate and Rhythm: Normal rate and regular rhythm.     Pulses: Normal pulses.     Heart sounds: No murmur heard.    No friction rub. No gallop.  Pulmonary:     Effort: Pulmonary effort is normal. No respiratory distress.     Breath sounds: Normal breath sounds. No stridor. No wheezing, rhonchi or rales.  Chest:     Chest wall: No tenderness.  Abdominal:     General: Abdomen is flat. Bowel sounds are normal. There is no distension.     Palpations: Abdomen is soft. There is no mass.     Tenderness: There is no abdominal tenderness. There is no right CVA tenderness, left CVA tenderness, guarding or rebound.     Hernia: No hernia is present.  Genitourinary:    Comments: Breast and pelvic exams deferred with shared decision making Musculoskeletal:        General: No swelling, tenderness, deformity or signs of injury.     Cervical back: Normal range of motion and neck supple. No rigidity. No muscular tenderness.     Right lower leg: No edema.     Left lower leg: No edema.  Lymphadenopathy:     Cervical: No cervical adenopathy.  Skin:    General: Skin is warm and dry.     Capillary Refill: Capillary refill  takes less than 2 seconds.     Coloration: Skin is not jaundiced or pale.     Findings: No bruising, erythema, lesion or rash.  Neurological:     General: No focal deficit present.     Mental Status: She is alert and oriented to person, place, and time. Mental status is at baseline.     Cranial Nerves: No cranial nerve deficit.     Sensory: No sensory deficit.     Motor: No weakness.     Coordination: Coordination normal.     Gait: Gait normal.     Deep Tendon Reflexes: Reflexes normal.  Psychiatric:        Mood and Affect: Mood normal.        Behavior: Behavior normal.        Thought Content: Thought content normal.        Judgment: Judgment normal.     Results for orders placed or performed in visit on 08/02/22  Urinalysis, Routine w reflex microscopic   Collection Time: 08/02/22  2:12 PM  Result Value Ref Range   Specific Gravity, UA 1.015 1.005 - 1.030   pH, UA 7.0 5.0 - 7.5   Color, UA Yellow Yellow   Appearance Ur Clear Clear   Leukocytes,UA Negative  Negative   Protein,UA Negative Negative/Trace   Glucose, UA Negative Negative   Ketones, UA Negative Negative   RBC, UA Negative Negative   Bilirubin, UA Negative Negative   Urobilinogen, Ur 0.2 0.2 - 1.0 mg/dL   Nitrite, UA Negative Negative   Microscopic Examination Comment   CBC with Differential/Platelet   Collection Time: 08/02/22  2:14 PM  Result Value Ref Range   WBC 2.9 (L) 3.4 - 10.8 x10E3/uL   RBC 5.11 3.77 - 5.28 x10E6/uL   Hemoglobin 14.5 11.1 - 15.9 g/dL   Hematocrit 95.6 21.3 - 46.6 %   MCV 85 79 - 97 fL   MCH 28.4 26.6 - 33.0 pg   MCHC 33.5 31.5 - 35.7 g/dL   RDW 08.6 57.8 - 46.9 %   Platelets 260 150 - 450 x10E3/uL   Neutrophils 41 Not Estab. %   Lymphs 45 Not Estab. %   Monocytes 13 Not Estab. %   Eos 0 Not Estab. %   Basos 1 Not Estab. %   Neutrophils Absolute 1.2 (L) 1.4 - 7.0 x10E3/uL   Lymphocytes Absolute 1.3 0.7 - 3.1 x10E3/uL   Monocytes Absolute 0.4 0.1 - 0.9 x10E3/uL   EOS  (ABSOLUTE) 0.0 0.0 - 0.4 x10E3/uL   Basophils Absolute 0.0 0.0 - 0.2 x10E3/uL   Immature Granulocytes 0 Not Estab. %   Immature Grans (Abs) 0.0 0.0 - 0.1 x10E3/uL  Comprehensive metabolic panel   Collection Time: 08/02/22  2:14 PM  Result Value Ref Range   Glucose 83 70 - 99 mg/dL   BUN 8 6 - 20 mg/dL   Creatinine, Ser 6.29 0.57 - 1.00 mg/dL   eGFR 528 >41 LK/GMW/1.02   BUN/Creatinine Ratio 12 9 - 23   Sodium 137 134 - 144 mmol/L   Potassium 4.3 3.5 - 5.2 mmol/L   Chloride 101 96 - 106 mmol/L   CO2 20 20 - 29 mmol/L   Calcium 10.1 8.7 - 10.2 mg/dL   Total Protein 7.0 6.0 - 8.5 g/dL   Albumin 5.2 (H) 3.9 - 4.9 g/dL   Globulin, Total 1.8 1.5 - 4.5 g/dL   Albumin/Globulin Ratio 2.9 (H) 1.2 - 2.2   Bilirubin Total 0.4 0.0 - 1.2 mg/dL   Alkaline Phosphatase 43 (L) 44 - 121 IU/L   AST 26 0 - 40 IU/L   ALT 33 (H) 0 - 32 IU/L  Lipid Panel w/o Chol/HDL Ratio   Collection Time: 08/02/22  2:14 PM  Result Value Ref Range   Cholesterol, Total 227 (H) 100 - 199 mg/dL   Triglycerides 61 0 - 149 mg/dL   HDL 88 >72 mg/dL   VLDL Cholesterol Cal 10 5 - 40 mg/dL   LDL Chol Calc (NIH) 536 (H) 0 - 99 mg/dL  TSH   Collection Time: 08/02/22  2:14 PM  Result Value Ref Range   TSH 1.620 0.450 - 4.500 uIU/mL      Assessment & Plan:   Problem List Items Addressed This Visit   None Visit Diagnoses       Routine general medical examination at a health care facility    -  Primary   Vaccines up to date. Screening labs checked today. Pap and mammo up to date. Continue diet and exercise. Call with any concerns.   Relevant Orders   CBC with Differential/Platelet   Comprehensive metabolic panel with GFR   Lipid Panel w/o Chol/HDL Ratio   TSH        Follow up plan: Return  in about 1 year (around 10/16/2024) for physical.   LABORATORY TESTING:  - Pap smear: up to date  IMMUNIZATIONS:   - Tdap: Tetanus vaccination status reviewed: last tetanus booster within 10 years. - Influenza: Postponed  to flu season - Pneumovax: Not applicable - Prevnar: Not applicable - COVID: Refused - HPV: Up to date - Shingrix vaccine: Not applicable  SCREENING: -Mammogram: Up to date   PATIENT COUNSELING:   Advised to take 1 mg of folate supplement per day if capable of pregnancy.   Sexuality: Discussed sexually transmitted diseases, partner selection, use of condoms, avoidance of unintended pregnancy  and contraceptive alternatives.   Advised to avoid cigarette smoking.  I discussed with the patient that most people either abstain from alcohol or drink within safe limits (<=14/week and <=4 drinks/occasion for males, <=7/weeks and <= 3 drinks/occasion for females) and that the risk for alcohol disorders and other health effects rises proportionally with the number of drinks per week and how often a drinker exceeds daily limits.  Discussed cessation/primary prevention of drug use and availability of treatment for abuse.   Diet: Encouraged to adjust caloric intake to maintain  or achieve ideal body weight, to reduce intake of dietary saturated fat and total fat, to limit sodium intake by avoiding high sodium foods and not adding table salt, and to maintain adequate dietary potassium and calcium preferably from fresh fruits, vegetables, and low-fat dairy products.    stressed the importance of regular exercise  Injury prevention: Discussed safety belts, safety helmets, smoke detector, smoking near bedding or upholstery.   Dental health: Discussed importance of regular tooth brushing, flossing, and dental visits.    NEXT PREVENTATIVE PHYSICAL DUE IN 1 YEAR. Return in about 1 year (around 10/16/2024) for physical.

## 2023-10-18 ENCOUNTER — Encounter: Payer: Self-pay | Admitting: Family Medicine

## 2023-10-18 LAB — COMPREHENSIVE METABOLIC PANEL WITH GFR
ALT: 16 IU/L (ref 0–32)
AST: 21 IU/L (ref 0–40)
Albumin: 5 g/dL — ABNORMAL HIGH (ref 3.9–4.9)
Alkaline Phosphatase: 48 IU/L (ref 44–121)
BUN/Creatinine Ratio: 8 — ABNORMAL LOW (ref 9–23)
BUN: 6 mg/dL (ref 6–20)
Bilirubin Total: 0.6 mg/dL (ref 0.0–1.2)
CO2: 20 mmol/L (ref 20–29)
Calcium: 10 mg/dL (ref 8.7–10.2)
Chloride: 97 mmol/L (ref 96–106)
Creatinine, Ser: 0.75 mg/dL (ref 0.57–1.00)
Globulin, Total: 2.4 g/dL (ref 1.5–4.5)
Glucose: 80 mg/dL (ref 70–99)
Potassium: 4.5 mmol/L (ref 3.5–5.2)
Sodium: 137 mmol/L (ref 134–144)
Total Protein: 7.4 g/dL (ref 6.0–8.5)
eGFR: 106 mL/min/{1.73_m2} (ref >59)

## 2023-10-18 LAB — TSH: TSH: 1.47 u[IU]/mL (ref 0.450–4.500)

## 2023-10-18 LAB — CBC WITH DIFFERENTIAL/PLATELET
Basophils Absolute: 0 10*3/uL (ref 0.0–0.2)
Basos: 1 %
EOS (ABSOLUTE): 0 10*3/uL (ref 0.0–0.4)
Eos: 0 %
Hematocrit: 47.6 % — ABNORMAL HIGH (ref 34.0–46.6)
Hemoglobin: 15.4 g/dL (ref 11.1–15.9)
Immature Grans (Abs): 0 10*3/uL (ref 0.0–0.1)
Immature Granulocytes: 0 %
Lymphocytes Absolute: 1.2 10*3/uL (ref 0.7–3.1)
Lymphs: 33 %
MCH: 28 pg (ref 26.6–33.0)
MCHC: 32.4 g/dL (ref 31.5–35.7)
MCV: 87 fL (ref 79–97)
Monocytes Absolute: 0.3 10*3/uL (ref 0.1–0.9)
Monocytes: 9 %
Neutrophils Absolute: 2.2 10*3/uL (ref 1.4–7.0)
Neutrophils: 57 %
Platelets: 345 10*3/uL (ref 150–450)
RBC: 5.5 x10E6/uL — ABNORMAL HIGH (ref 3.77–5.28)
RDW: 13.1 % (ref 11.7–15.4)
WBC: 3.8 10*3/uL (ref 3.4–10.8)

## 2023-10-18 LAB — LIPID PANEL W/O CHOL/HDL RATIO
Cholesterol, Total: 276 mg/dL — ABNORMAL HIGH (ref 100–199)
HDL: 71 mg/dL (ref >39)
LDL Chol Calc (NIH): 187 mg/dL — ABNORMAL HIGH (ref 0–99)
Triglycerides: 106 mg/dL (ref 0–149)
VLDL Cholesterol Cal: 18 mg/dL (ref 5–40)

## 2023-10-21 ENCOUNTER — Ambulatory Visit

## 2024-01-14 ENCOUNTER — Telehealth: Admitting: Family Medicine

## 2024-01-14 ENCOUNTER — Encounter: Payer: Self-pay | Admitting: Family Medicine

## 2024-01-14 VITALS — Ht 59.0 in | Wt 135.0 lb

## 2024-01-14 DIAGNOSIS — T63461A Toxic effect of venom of wasps, accidental (unintentional), initial encounter: Secondary | ICD-10-CM | POA: Diagnosis not present

## 2024-01-14 DIAGNOSIS — M7989 Other specified soft tissue disorders: Secondary | ICD-10-CM | POA: Diagnosis not present

## 2024-01-14 MED ORDER — PREDNISONE 10 MG PO TABS: ORAL_TABLET | ORAL | 0 refills | Status: AC

## 2024-01-14 NOTE — Telephone Encounter (Signed)
 I can see her virtually today at 2:20 or 2:40 if she'd like

## 2024-01-14 NOTE — Progress Notes (Signed)
 Ht 4' 11 (1.499 m)   Wt 135 lb (61.2 kg)   BMI 27.27 kg/m    Subjective:    Patient ID: Shelby Acosta, female    DOB: 04-Feb-1986, 38 y.o.   MRN: [id number]  HPI: Shelby Acosta is a 38 y.o. female  Chief Complaint  Patient presents with   Insect Bite    Wasp string. Right hand. Onset about 4 days. Redness, itching, swelling, burning sensation. OTC no relief.    WASP STING Duration: 4 days Location: Right hand History of trauma in area: yes- multiple wasp stings Pain: yes Quality: burning and itching Severity: moderate Redness: yes Swelling: yes Oozing: no Pus: no Fevers: no Nausea/vomiting: no Status: worse Treatments attempted:OTC cortisone, benadryl and warm compresses  Tetanus: UTD  Relevant past medical, surgical, family and social history reviewed and updated as indicated. Interim medical history since our last visit reviewed. Allergies and medications reviewed and updated.  Review of Systems  Constitutional: Negative.   Respiratory: Negative.    Cardiovascular: Negative.   Gastrointestinal: Negative.   Skin:  Positive for color change and wound. Negative for pallor and rash.  Neurological: Negative.   Psychiatric/Behavioral: Negative.      Per HPI unless specifically indicated above     Objective:    Ht 4' 11 (1.499 m)   Wt 135 lb (61.2 kg)   BMI 27.27 kg/m   Wt Readings from Last 3 Encounters:  01/14/24 135 lb (61.2 kg)  10/17/23 134 lb 12.8 oz (61.1 kg)  10/11/23 136 lb 9.6 oz (62 kg)    Physical Exam Vitals and nursing note reviewed.  Constitutional:      General: She is not in acute distress.    Appearance: Normal appearance. She is not ill-appearing, toxic-appearing or diaphoretic.  HENT:     Head: Normocephalic and atraumatic.     Right Ear: External ear normal.     Left Ear: External ear normal.     Nose: Nose normal.     Mouth/Throat:     Mouth: Mucous membranes are moist.     Pharynx: Oropharynx is clear.   Eyes:      General: No scleral icterus.       Right eye: No discharge.        Left eye: No discharge.     Conjunctiva/sclera: Conjunctivae normal.     Pupils: Pupils are equal, round, and reactive to light.   Pulmonary:     Effort: Pulmonary effort is normal. No respiratory distress.     Comments: Speaking in full sentences  Musculoskeletal:        General: Normal range of motion.     Cervical back: Normal range of motion.   Skin:    Coloration: Skin is not jaundiced or pale.     Findings: Erythema (redness and swelling to back of R hand) present. No bruising, lesion or rash.   Neurological:     Mental Status: She is alert and oriented to person, place, and time. Mental status is at baseline.   Psychiatric:        Mood and Affect: Mood normal.        Behavior: Behavior normal.        Thought Content: Thought content normal.        Judgment: Judgment normal.     Results for orders placed or performed in visit on 10/17/23  CBC with Differential/Platelet   Collection Time: 10/17/23  2:35 PM  Result Value Ref Range  WBC 3.8 3.4 - 10.8 x10E3/uL   RBC 5.50 (H) 3.77 - 5.28 x10E6/uL   Hemoglobin 15.4 11.1 - 15.9 g/dL   Hematocrit 52.3 (H) 65.9 - 46.6 %   MCV 87 79 - 97 fL   MCH 28.0 26.6 - 33.0 pg   MCHC 32.4 31.5 - 35.7 g/dL   RDW 86.8 88.2 - 84.5 %   Platelets 345 150 - 450 x10E3/uL   Neutrophils 57 Not Estab. %   Lymphs 33 Not Estab. %   Monocytes 9 Not Estab. %   Eos 0 Not Estab. %   Basos 1 Not Estab. %   Neutrophils Absolute 2.2 1.4 - 7.0 x10E3/uL   Lymphocytes Absolute 1.2 0.7 - 3.1 x10E3/uL   Monocytes Absolute 0.3 0.1 - 0.9 x10E3/uL   EOS (ABSOLUTE) 0.0 0.0 - 0.4 x10E3/uL   Basophils Absolute 0.0 0.0 - 0.2 x10E3/uL   Immature Granulocytes 0 Not Estab. %   Immature Grans (Abs) 0.0 0.0 - 0.1 x10E3/uL  Comprehensive metabolic panel with GFR   Collection Time: 10/17/23  2:35 PM  Result Value Ref Range   Glucose 80 70 - 99 mg/dL   BUN 6 6 - 20 mg/dL   Creatinine, Ser 9.24  0.57 - 1.00 mg/dL   eGFR 893 >40 fO/fpw/8.26   BUN/Creatinine Ratio 8 (L) 9 - 23   Sodium 137 134 - 144 mmol/L   Potassium 4.5 3.5 - 5.2 mmol/L   Chloride 97 96 - 106 mmol/L   CO2 20 20 - 29 mmol/L   Calcium 10.0 8.7 - 10.2 mg/dL   Total Protein 7.4 6.0 - 8.5 g/dL   Albumin 5.0 (H) 3.9 - 4.9 g/dL   Globulin, Total 2.4 1.5 - 4.5 g/dL   Bilirubin Total 0.6 0.0 - 1.2 mg/dL   Alkaline Phosphatase 48 44 - 121 IU/L   AST 21 0 - 40 IU/L   ALT 16 0 - 32 IU/L  Lipid Panel w/o Chol/HDL Ratio   Collection Time: 10/17/23  2:35 PM  Result Value Ref Range   Cholesterol, Total 276 (H) 100 - 199 mg/dL   Triglycerides 893 0 - 149 mg/dL   HDL 71 >60 mg/dL   VLDL Cholesterol Cal 18 5 - 40 mg/dL   LDL Chol Calc (NIH) 812 (H) 0 - 99 mg/dL  TSH   Collection Time: 10/17/23  2:35 PM  Result Value Ref Range   TSH 1.470 0.450 - 4.500 uIU/mL      Assessment & Plan:   Problem List Items Addressed This Visit   None Visit Diagnoses       Hand swelling    -  Primary   Will treat with steroid taper. Call if not getting better or if getting worse. Continue to monitor.     Wasp sting, accidental or unintentional, initial encounter       Will treat with steroid taper. Call if not getting better or if getting worse. Continue to monitor.        Follow up plan: Return if symptoms worsen or fail to improve.    This visit was completed via video visit through MyChart due to the restrictions of the COVID-19 pandemic. All issues as above were discussed and addressed. Physical exam was done as above through visual confirmation on video through MyChart. If it was felt that the patient should be evaluated in the office, they were directed there. The patient verbally consented to this visit. Location of the patient: home Location of the provider: work  Those involved with this call:  Provider: Duwaine Louder, DO CMA: York Fogo, CMA, Front Desk/Registration: Claretta Maiden  Time spent on call: 15 minutes  with patient face to face via video conference. More than 50% of this time was spent in counseling and coordination of care. 23 minutes total spent in review of patient's record and preparation of their chart.

## 2024-10-09 ENCOUNTER — Ambulatory Visit: Admitting: Oncology

## 2024-10-16 ENCOUNTER — Encounter: Admitting: Family Medicine
